# Patient Record
Sex: Female | Born: 1975 | Race: Black or African American | Hispanic: No | Smoking: Current some day smoker
Health system: Southern US, Community
[De-identification: ages and names within clinical notes are randomized; demographics above are authoritative.]

## PROBLEM LIST (undated history)

## (undated) DIAGNOSIS — K5792 Diverticulitis of intestine, part unspecified, without perforation or abscess without bleeding: Secondary | ICD-10-CM

## (undated) DIAGNOSIS — G43909 Migraine, unspecified, not intractable, without status migrainosus: Secondary | ICD-10-CM

## (undated) DIAGNOSIS — D649 Anemia, unspecified: Secondary | ICD-10-CM

## (undated) DIAGNOSIS — J42 Unspecified chronic bronchitis: Secondary | ICD-10-CM

## (undated) DIAGNOSIS — F410 Panic disorder [episodic paroxysmal anxiety] without agoraphobia: Secondary | ICD-10-CM

## (undated) DIAGNOSIS — J45909 Unspecified asthma, uncomplicated: Secondary | ICD-10-CM

## (undated) DIAGNOSIS — K589 Irritable bowel syndrome without diarrhea: Secondary | ICD-10-CM

## (undated) DIAGNOSIS — F419 Anxiety disorder, unspecified: Secondary | ICD-10-CM

## (undated) HISTORY — PX: LAPAROSCOPIC ABDOMINAL EXPLORATION: SHX6249

## (undated) HISTORY — DX: Diverticulitis of intestine, part unspecified, without perforation or abscess without bleeding: K57.92

---
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
## 1998-06-23 ENCOUNTER — Other Ambulatory Visit: Admission: RE | Admit: 1998-06-23 | Discharge: 1998-06-23 | Payer: Self-pay | Admitting: Obstetrics and Gynecology

## 1999-01-09 ENCOUNTER — Inpatient Hospital Stay (HOSPITAL_COMMUNITY): Admission: AD | Admit: 1999-01-09 | Discharge: 1999-01-11 | Payer: Self-pay | Admitting: Obstetrics and Gynecology

## 1999-01-12 ENCOUNTER — Encounter (HOSPITAL_COMMUNITY): Admission: RE | Admit: 1999-01-12 | Discharge: 1999-04-12 | Payer: Self-pay | Admitting: *Deleted

## 1999-04-19 ENCOUNTER — Encounter (HOSPITAL_COMMUNITY): Admission: RE | Admit: 1999-04-19 | Discharge: 1999-07-18 | Payer: Self-pay | Admitting: *Deleted

## 1999-04-21 ENCOUNTER — Observation Stay (HOSPITAL_COMMUNITY): Admission: EM | Admit: 1999-04-21 | Discharge: 1999-04-21 | Payer: Self-pay | Admitting: *Deleted

## 1999-05-23 ENCOUNTER — Other Ambulatory Visit: Admission: RE | Admit: 1999-05-23 | Discharge: 1999-05-23 | Payer: Self-pay | Admitting: Obstetrics & Gynecology

## 1999-11-30 ENCOUNTER — Encounter: Payer: Self-pay | Admitting: Emergency Medicine

## 1999-11-30 ENCOUNTER — Emergency Department (HOSPITAL_COMMUNITY): Admission: EM | Admit: 1999-11-30 | Discharge: 1999-11-30 | Payer: Self-pay | Admitting: Emergency Medicine

## 1999-12-02 ENCOUNTER — Encounter: Payer: Self-pay | Admitting: Emergency Medicine

## 1999-12-02 ENCOUNTER — Emergency Department (HOSPITAL_COMMUNITY): Admission: EM | Admit: 1999-12-02 | Discharge: 1999-12-02 | Payer: Self-pay | Admitting: Emergency Medicine

## 2000-06-07 ENCOUNTER — Emergency Department (HOSPITAL_COMMUNITY): Admission: EM | Admit: 2000-06-07 | Discharge: 2000-06-07 | Payer: Self-pay | Admitting: Internal Medicine

## 2001-06-11 ENCOUNTER — Encounter: Payer: Self-pay | Admitting: Emergency Medicine

## 2001-06-11 ENCOUNTER — Emergency Department (HOSPITAL_COMMUNITY): Admission: EM | Admit: 2001-06-11 | Discharge: 2001-06-11 | Payer: Self-pay | Admitting: Emergency Medicine

## 2001-09-21 ENCOUNTER — Emergency Department (HOSPITAL_COMMUNITY): Admission: EM | Admit: 2001-09-21 | Discharge: 2001-09-21 | Payer: Self-pay | Admitting: Emergency Medicine

## 2001-09-21 ENCOUNTER — Encounter: Payer: Self-pay | Admitting: Emergency Medicine

## 2001-09-26 ENCOUNTER — Other Ambulatory Visit: Admission: RE | Admit: 2001-09-26 | Discharge: 2001-09-26 | Payer: Self-pay | Admitting: *Deleted

## 2002-06-22 ENCOUNTER — Encounter: Admission: RE | Admit: 2002-06-22 | Discharge: 2002-06-22 | Payer: Self-pay | Admitting: Obstetrics and Gynecology

## 2002-06-22 ENCOUNTER — Encounter: Payer: Self-pay | Admitting: Obstetrics and Gynecology

## 2002-06-23 ENCOUNTER — Encounter: Payer: Self-pay | Admitting: Obstetrics and Gynecology

## 2002-06-23 ENCOUNTER — Encounter: Admission: RE | Admit: 2002-06-23 | Discharge: 2002-06-23 | Payer: Self-pay | Admitting: Obstetrics and Gynecology

## 2002-08-26 ENCOUNTER — Encounter: Payer: Self-pay | Admitting: *Deleted

## 2002-08-26 ENCOUNTER — Ambulatory Visit (HOSPITAL_COMMUNITY): Admission: RE | Admit: 2002-08-26 | Discharge: 2002-08-26 | Payer: Self-pay | Admitting: *Deleted

## 2002-08-28 ENCOUNTER — Emergency Department (HOSPITAL_COMMUNITY): Admission: EM | Admit: 2002-08-28 | Discharge: 2002-08-28 | Payer: Self-pay | Admitting: Emergency Medicine

## 2002-11-04 ENCOUNTER — Other Ambulatory Visit: Admission: RE | Admit: 2002-11-04 | Discharge: 2002-11-04 | Payer: Self-pay | Admitting: *Deleted

## 2003-06-15 ENCOUNTER — Emergency Department (HOSPITAL_COMMUNITY): Admission: EM | Admit: 2003-06-15 | Discharge: 2003-06-16 | Payer: Self-pay | Admitting: Emergency Medicine

## 2003-09-01 ENCOUNTER — Ambulatory Visit (HOSPITAL_COMMUNITY): Admission: RE | Admit: 2003-09-01 | Discharge: 2003-09-01 | Payer: Self-pay | Admitting: *Deleted

## 2003-09-11 HISTORY — PX: TUBAL LIGATION: SHX77

## 2003-09-12 ENCOUNTER — Emergency Department (HOSPITAL_COMMUNITY): Admission: AD | Admit: 2003-09-12 | Discharge: 2003-09-12 | Payer: Self-pay | Admitting: Family Medicine

## 2003-10-27 ENCOUNTER — Emergency Department (HOSPITAL_COMMUNITY): Admission: EM | Admit: 2003-10-27 | Discharge: 2003-10-27 | Payer: Self-pay | Admitting: Emergency Medicine

## 2003-11-19 ENCOUNTER — Other Ambulatory Visit: Admission: RE | Admit: 2003-11-19 | Discharge: 2003-11-19 | Payer: Self-pay | Admitting: *Deleted

## 2004-04-11 ENCOUNTER — Inpatient Hospital Stay (HOSPITAL_COMMUNITY): Admission: AD | Admit: 2004-04-11 | Discharge: 2004-04-11 | Payer: Self-pay | Admitting: *Deleted

## 2004-05-10 ENCOUNTER — Inpatient Hospital Stay (HOSPITAL_COMMUNITY): Admission: AD | Admit: 2004-05-10 | Discharge: 2004-05-10 | Payer: Self-pay

## 2004-07-13 ENCOUNTER — Inpatient Hospital Stay (HOSPITAL_COMMUNITY): Admission: AD | Admit: 2004-07-13 | Discharge: 2004-07-13 | Payer: Self-pay | Admitting: Obstetrics and Gynecology

## 2004-07-19 ENCOUNTER — Inpatient Hospital Stay (HOSPITAL_COMMUNITY): Admission: AD | Admit: 2004-07-19 | Discharge: 2004-07-21 | Payer: Self-pay | Admitting: Obstetrics and Gynecology

## 2004-07-20 ENCOUNTER — Encounter (INDEPENDENT_AMBULATORY_CARE_PROVIDER_SITE_OTHER): Payer: Self-pay | Admitting: *Deleted

## 2004-08-13 ENCOUNTER — Emergency Department (HOSPITAL_COMMUNITY): Admission: EM | Admit: 2004-08-13 | Discharge: 2004-08-13 | Payer: Self-pay

## 2004-09-10 HISTORY — PX: VAGINAL HYSTERECTOMY: SUR661

## 2004-10-03 ENCOUNTER — Emergency Department (HOSPITAL_COMMUNITY): Admission: EM | Admit: 2004-10-03 | Discharge: 2004-10-03 | Payer: Self-pay | Admitting: Emergency Medicine

## 2004-11-29 ENCOUNTER — Inpatient Hospital Stay (HOSPITAL_COMMUNITY): Admission: AD | Admit: 2004-11-29 | Discharge: 2004-11-29 | Payer: Self-pay | Admitting: Obstetrics and Gynecology

## 2005-03-03 ENCOUNTER — Inpatient Hospital Stay (HOSPITAL_COMMUNITY): Admission: AD | Admit: 2005-03-03 | Discharge: 2005-03-03 | Payer: Self-pay | Admitting: Family Medicine

## 2005-03-22 ENCOUNTER — Ambulatory Visit: Payer: Self-pay | Admitting: Obstetrics and Gynecology

## 2005-03-31 ENCOUNTER — Inpatient Hospital Stay (HOSPITAL_COMMUNITY): Admission: AD | Admit: 2005-03-31 | Discharge: 2005-03-31 | Payer: Self-pay | Admitting: Family Medicine

## 2005-05-02 ENCOUNTER — Ambulatory Visit (HOSPITAL_COMMUNITY): Admission: RE | Admit: 2005-05-02 | Discharge: 2005-05-02 | Payer: Self-pay | Admitting: Obstetrics and Gynecology

## 2005-05-08 ENCOUNTER — Encounter: Payer: Self-pay | Admitting: Emergency Medicine

## 2005-05-09 ENCOUNTER — Inpatient Hospital Stay (HOSPITAL_COMMUNITY): Admission: AD | Admit: 2005-05-09 | Discharge: 2005-05-11 | Payer: Self-pay | Admitting: General Surgery

## 2005-06-04 ENCOUNTER — Inpatient Hospital Stay (HOSPITAL_COMMUNITY): Admission: RE | Admit: 2005-06-04 | Discharge: 2005-06-06 | Payer: Self-pay | Admitting: Obstetrics and Gynecology

## 2005-06-04 ENCOUNTER — Encounter (INDEPENDENT_AMBULATORY_CARE_PROVIDER_SITE_OTHER): Payer: Self-pay | Admitting: *Deleted

## 2005-06-04 ENCOUNTER — Ambulatory Visit: Payer: Self-pay | Admitting: Obstetrics and Gynecology

## 2005-06-08 ENCOUNTER — Inpatient Hospital Stay (HOSPITAL_COMMUNITY): Admission: AD | Admit: 2005-06-08 | Discharge: 2005-06-11 | Payer: Self-pay | Admitting: *Deleted

## 2005-06-08 ENCOUNTER — Ambulatory Visit: Payer: Self-pay | Admitting: Certified Nurse Midwife

## 2005-06-14 ENCOUNTER — Ambulatory Visit: Payer: Self-pay | Admitting: Obstetrics and Gynecology

## 2005-07-12 ENCOUNTER — Ambulatory Visit: Payer: Self-pay | Admitting: Family Medicine

## 2005-08-04 ENCOUNTER — Emergency Department (HOSPITAL_COMMUNITY): Admission: EM | Admit: 2005-08-04 | Discharge: 2005-08-04 | Payer: Self-pay | Admitting: Family Medicine

## 2005-09-11 ENCOUNTER — Emergency Department (HOSPITAL_COMMUNITY): Admission: EM | Admit: 2005-09-11 | Discharge: 2005-09-11 | Payer: Self-pay | Admitting: Family Medicine

## 2005-11-09 ENCOUNTER — Ambulatory Visit: Payer: Self-pay | Admitting: Family Medicine

## 2005-11-12 ENCOUNTER — Ambulatory Visit (HOSPITAL_COMMUNITY): Admission: RE | Admit: 2005-11-12 | Discharge: 2005-11-12 | Payer: Self-pay | Admitting: *Deleted

## 2005-11-29 ENCOUNTER — Ambulatory Visit: Payer: Self-pay | Admitting: Family Medicine

## 2005-11-30 ENCOUNTER — Emergency Department (HOSPITAL_COMMUNITY): Admission: EM | Admit: 2005-11-30 | Discharge: 2005-11-30 | Payer: Self-pay | Admitting: Family Medicine

## 2005-12-26 ENCOUNTER — Emergency Department (HOSPITAL_COMMUNITY): Admission: EM | Admit: 2005-12-26 | Discharge: 2005-12-26 | Payer: Self-pay | Admitting: Family Medicine

## 2006-01-02 ENCOUNTER — Emergency Department (HOSPITAL_COMMUNITY): Admission: EM | Admit: 2006-01-02 | Discharge: 2006-01-02 | Payer: Self-pay | Admitting: Emergency Medicine

## 2006-01-22 ENCOUNTER — Emergency Department (HOSPITAL_COMMUNITY): Admission: EM | Admit: 2006-01-22 | Discharge: 2006-01-22 | Payer: Self-pay | Admitting: Emergency Medicine

## 2006-04-08 ENCOUNTER — Emergency Department (HOSPITAL_COMMUNITY): Admission: EM | Admit: 2006-04-08 | Discharge: 2006-04-08 | Payer: Self-pay | Admitting: Family Medicine

## 2006-04-10 ENCOUNTER — Ambulatory Visit: Payer: Self-pay | Admitting: Obstetrics and Gynecology

## 2006-04-18 ENCOUNTER — Emergency Department (HOSPITAL_COMMUNITY): Admission: EM | Admit: 2006-04-18 | Discharge: 2006-04-18 | Payer: Self-pay | Admitting: Family Medicine

## 2006-06-20 ENCOUNTER — Encounter: Admission: RE | Admit: 2006-06-20 | Discharge: 2006-06-20 | Payer: Self-pay | Admitting: Family Medicine

## 2006-08-28 IMAGING — US US TRANSVAGINAL NON-OB
1 series · 14 of 24 positions shown · non-contrast
Comparison: none

HISTORY: Endometriosis, left lower quadrant pain

[Series 1: us transvaginal non-ob · 0.17mm/px · 14 of 24 slices shown]
[im 1/24]
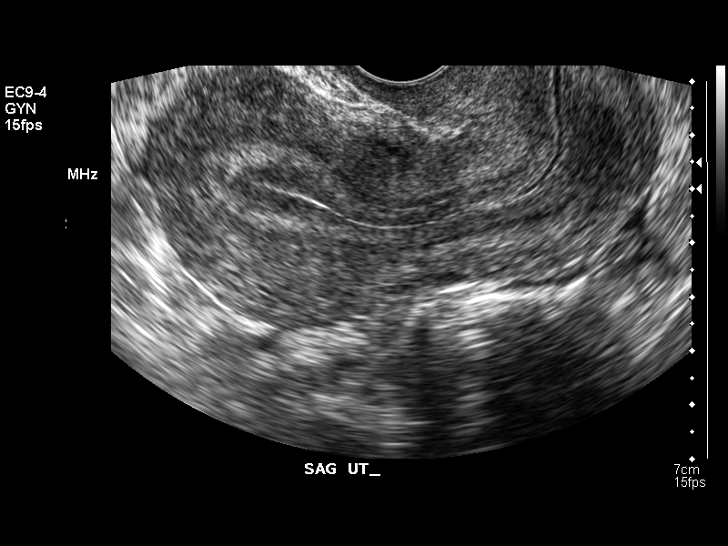
[im 3/24]
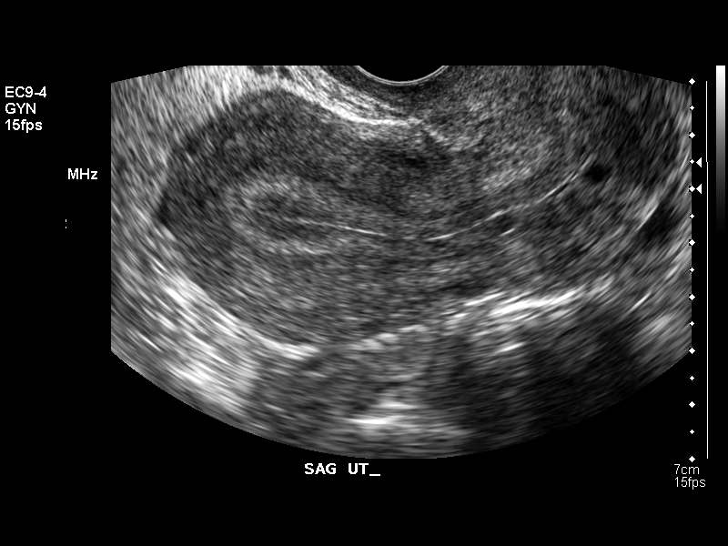
[im 5/24]
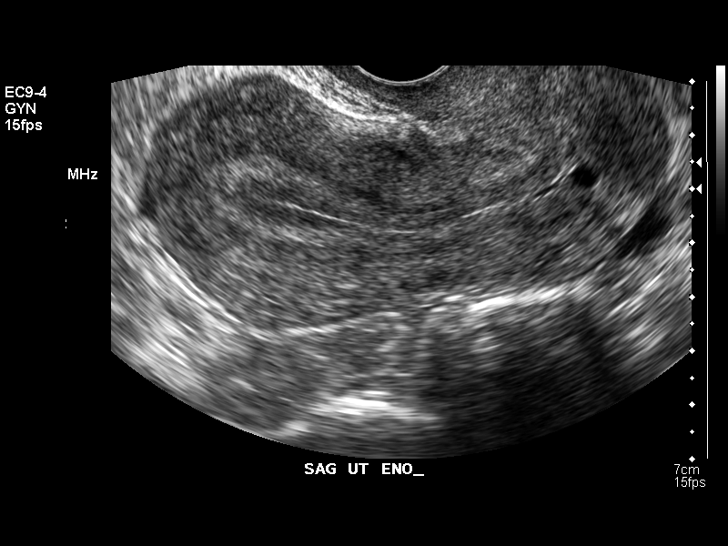
[im 7/24]
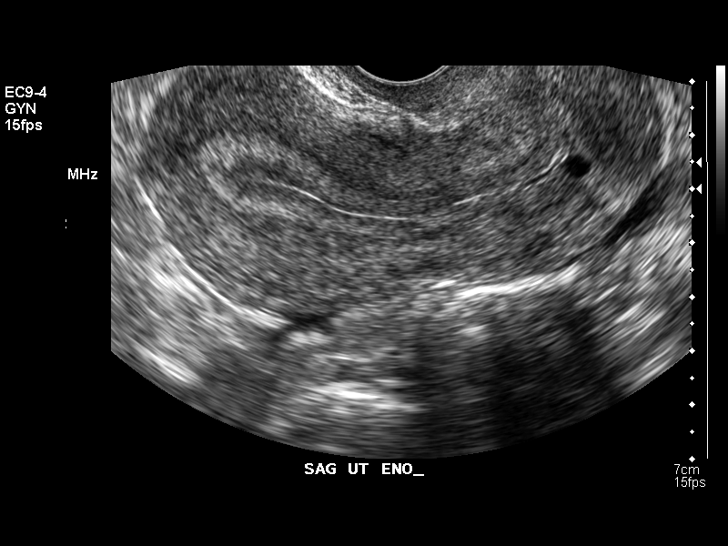
[im 8/24]
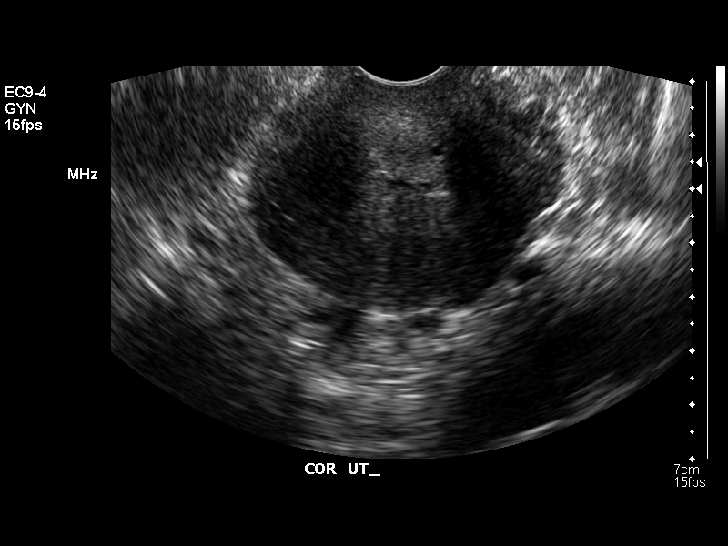
[im 10/24]
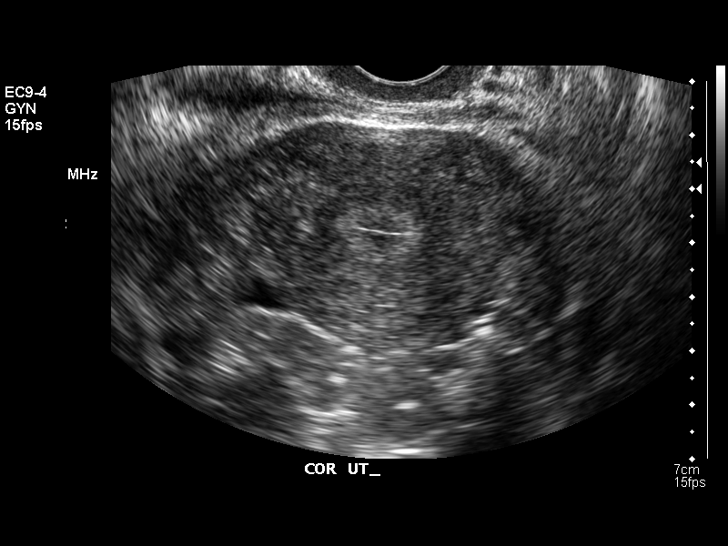
[im 12/24]
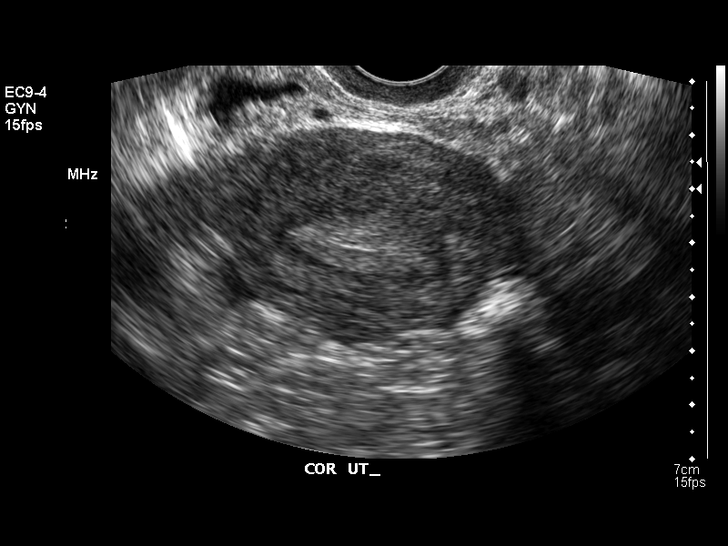
[im 13/24]
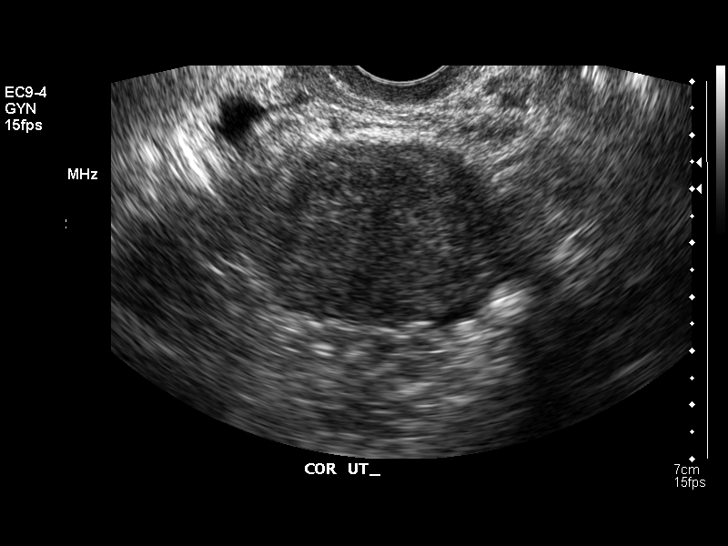
[im 15/24]
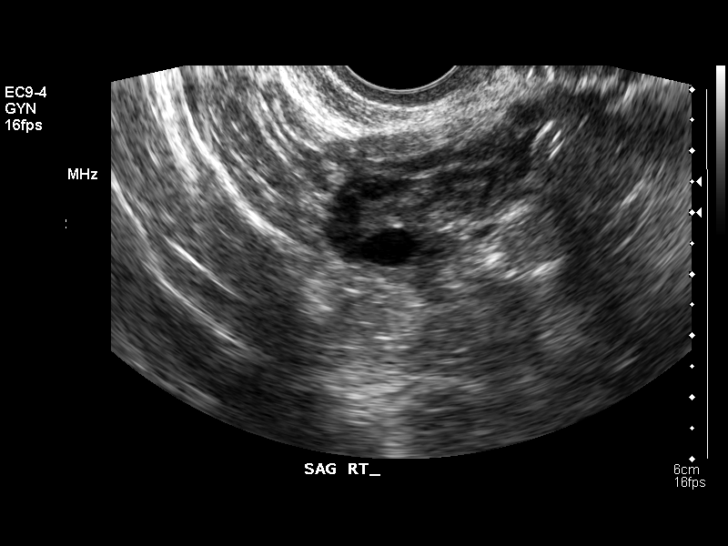
[im 17/24]
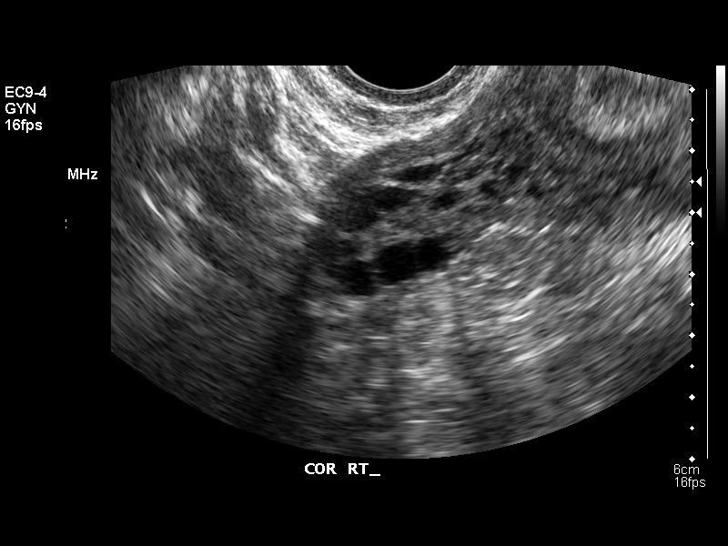
[im 19/24]
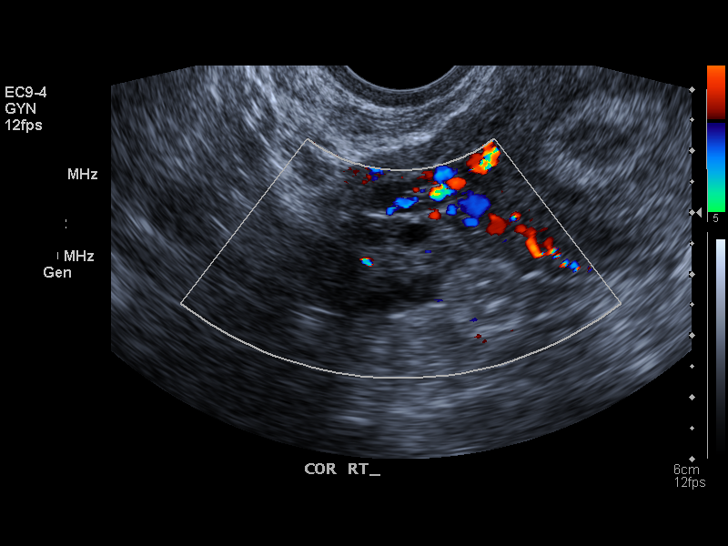
[im 20/24]
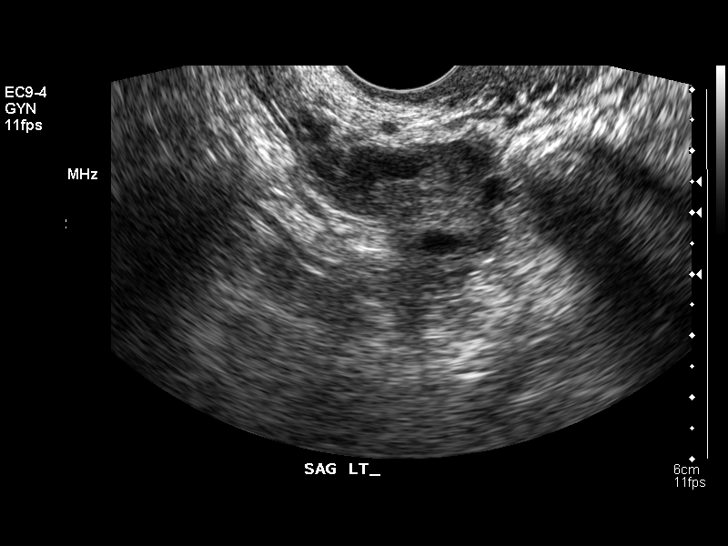
[im 22/24]
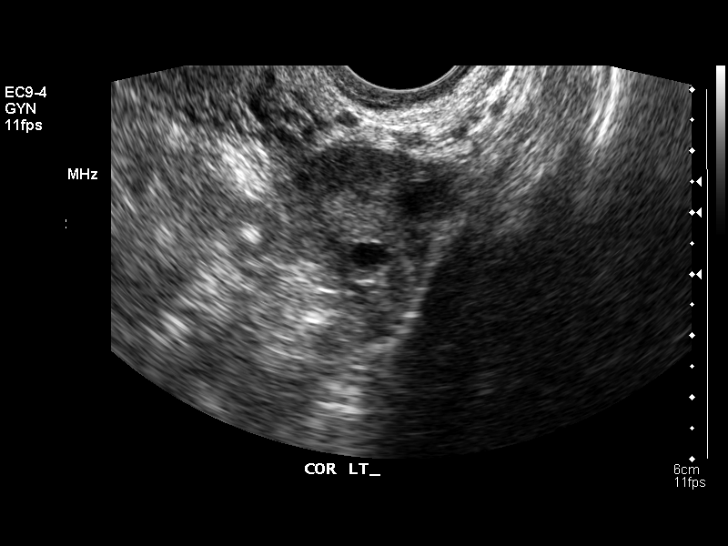
[im 24/24]
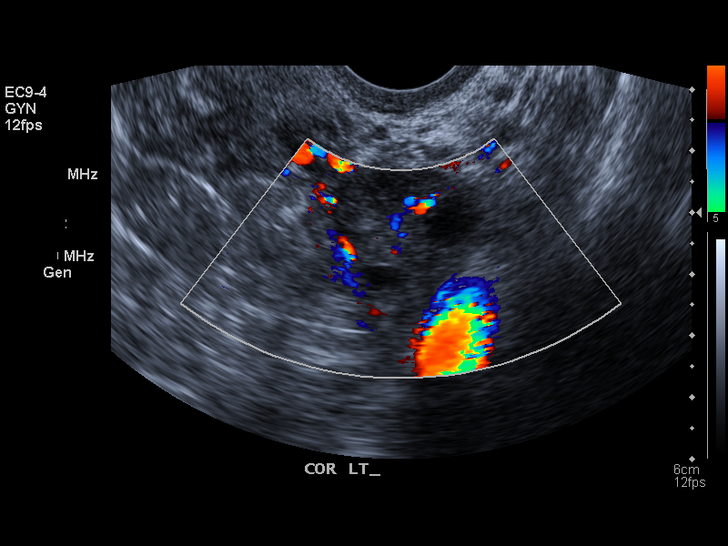

[14 of 24 positions shown; findings below may reference images not displayed]

ULTRASOUND PELVIS TRANSVAGINAL NON OBSTETRICAL:

Transvaginal sonography of pelvis performed.

Uterus normal size and morphology, 9.7 cm length x 4.8 cm AP x 5.2 cm
transverse.
Endometrial stripe 16 mm thick, question related to phase menses.
No uterine mass.
Tiny amount of free pelvic fluid.
Tiny nabothian cyst in cervix.
No endocervical or endometrial fluid.

Right ovary normal size morphology, 3.2 x 1.7 x 3.2 cm.
Left ovary normal size morphology 2.0 x 2.5 x 2.0 cm.
No adnexal masses.
IMPRESSION: No significant abnormalities.

## 2006-09-15 ENCOUNTER — Inpatient Hospital Stay (HOSPITAL_COMMUNITY): Admission: AD | Admit: 2006-09-15 | Discharge: 2006-09-15 | Payer: Self-pay | Admitting: Obstetrics & Gynecology

## 2006-09-23 ENCOUNTER — Emergency Department (HOSPITAL_COMMUNITY): Admission: EM | Admit: 2006-09-23 | Discharge: 2006-09-24 | Payer: Self-pay | Admitting: Emergency Medicine

## 2006-10-06 ENCOUNTER — Inpatient Hospital Stay (HOSPITAL_COMMUNITY): Admission: AD | Admit: 2006-10-06 | Discharge: 2006-10-06 | Payer: Self-pay | Admitting: Obstetrics & Gynecology

## 2006-11-13 ENCOUNTER — Emergency Department (HOSPITAL_COMMUNITY): Admission: EM | Admit: 2006-11-13 | Discharge: 2006-11-13 | Payer: Self-pay | Admitting: Family Medicine

## 2006-12-03 ENCOUNTER — Emergency Department (HOSPITAL_COMMUNITY): Admission: EM | Admit: 2006-12-03 | Discharge: 2006-12-03 | Payer: Self-pay | Admitting: Family Medicine

## 2007-01-15 ENCOUNTER — Ambulatory Visit: Payer: Self-pay | Admitting: Obstetrics & Gynecology

## 2007-01-24 ENCOUNTER — Emergency Department (HOSPITAL_COMMUNITY): Admission: EM | Admit: 2007-01-24 | Discharge: 2007-01-24 | Payer: Self-pay | Admitting: Emergency Medicine

## 2007-02-23 ENCOUNTER — Inpatient Hospital Stay (HOSPITAL_COMMUNITY): Admission: AD | Admit: 2007-02-23 | Discharge: 2007-02-23 | Payer: Self-pay | Admitting: Obstetrics & Gynecology

## 2007-03-11 ENCOUNTER — Emergency Department (HOSPITAL_COMMUNITY): Admission: EM | Admit: 2007-03-11 | Discharge: 2007-03-11 | Payer: Self-pay | Admitting: Family Medicine

## 2007-03-24 ENCOUNTER — Emergency Department (HOSPITAL_COMMUNITY): Admission: EM | Admit: 2007-03-24 | Discharge: 2007-03-24 | Payer: Self-pay | Admitting: Emergency Medicine

## 2007-03-31 ENCOUNTER — Encounter: Admission: RE | Admit: 2007-03-31 | Discharge: 2007-06-29 | Payer: Self-pay | Admitting: Family Medicine

## 2007-04-15 ENCOUNTER — Emergency Department (HOSPITAL_COMMUNITY): Admission: EM | Admit: 2007-04-15 | Discharge: 2007-04-15 | Payer: Self-pay | Admitting: Emergency Medicine

## 2007-05-01 ENCOUNTER — Ambulatory Visit: Payer: Self-pay | Admitting: Obstetrics and Gynecology

## 2007-07-27 ENCOUNTER — Emergency Department (HOSPITAL_COMMUNITY): Admission: EM | Admit: 2007-07-27 | Discharge: 2007-07-27 | Payer: Self-pay | Admitting: Emergency Medicine

## 2007-08-16 ENCOUNTER — Emergency Department (HOSPITAL_COMMUNITY): Admission: EM | Admit: 2007-08-16 | Discharge: 2007-08-16 | Payer: Self-pay | Admitting: Emergency Medicine

## 2007-12-04 ENCOUNTER — Emergency Department (HOSPITAL_COMMUNITY): Admission: EM | Admit: 2007-12-04 | Discharge: 2007-12-05 | Payer: Self-pay | Admitting: Emergency Medicine

## 2008-01-12 ENCOUNTER — Emergency Department (HOSPITAL_COMMUNITY): Admission: EM | Admit: 2008-01-12 | Discharge: 2008-01-12 | Payer: Self-pay | Admitting: Emergency Medicine

## 2008-02-06 ENCOUNTER — Emergency Department (HOSPITAL_COMMUNITY): Admission: EM | Admit: 2008-02-06 | Discharge: 2008-02-06 | Payer: Self-pay | Admitting: Emergency Medicine

## 2008-04-09 ENCOUNTER — Inpatient Hospital Stay (HOSPITAL_COMMUNITY): Admission: AD | Admit: 2008-04-09 | Discharge: 2008-04-09 | Payer: Self-pay | Admitting: Obstetrics & Gynecology

## 2008-04-30 ENCOUNTER — Inpatient Hospital Stay (HOSPITAL_COMMUNITY): Admission: AD | Admit: 2008-04-30 | Discharge: 2008-04-30 | Payer: Self-pay | Admitting: Obstetrics & Gynecology

## 2008-05-29 ENCOUNTER — Emergency Department (HOSPITAL_COMMUNITY): Admission: EM | Admit: 2008-05-29 | Discharge: 2008-05-29 | Payer: Self-pay | Admitting: Emergency Medicine

## 2008-09-29 ENCOUNTER — Emergency Department (HOSPITAL_COMMUNITY): Admission: EM | Admit: 2008-09-29 | Discharge: 2008-09-30 | Payer: Self-pay | Admitting: Emergency Medicine

## 2008-12-30 ENCOUNTER — Ambulatory Visit: Payer: Self-pay | Admitting: Internal Medicine

## 2008-12-30 DIAGNOSIS — D649 Anemia, unspecified: Secondary | ICD-10-CM | POA: Insufficient documentation

## 2008-12-30 DIAGNOSIS — R195 Other fecal abnormalities: Secondary | ICD-10-CM

## 2008-12-30 DIAGNOSIS — R1084 Generalized abdominal pain: Secondary | ICD-10-CM | POA: Insufficient documentation

## 2008-12-30 LAB — CONVERTED CEMR LAB
ALT: 14 units/L (ref 0–35)
AST: 15 units/L (ref 0–37)
Albumin: 4.5 g/dL (ref 3.5–5.2)
Alkaline Phosphatase: 54 units/L (ref 39–117)
Amylase: 86 units/L (ref 27–131)
BUN: 5 mg/dL — ABNORMAL LOW (ref 6–23)
CO2: 31 meq/L (ref 19–32)
Calcium: 9.4 mg/dL (ref 8.4–10.5)
Chloride: 105 meq/L (ref 96–112)
Creatinine, Ser: 0.8 mg/dL (ref 0.4–1.2)
Lymphocytes Relative: 10.3 % — ABNORMAL LOW (ref 12.0–46.0)
MCHC: 33.6 g/dL (ref 30.0–36.0)
MCV: 89 fL (ref 78.0–100.0)
Neutrophils Relative %: 87.8 % — ABNORMAL HIGH (ref 43.0–77.0)
Platelets: 218 10*3/uL (ref 150.0–400.0)
RDW: 13.5 % (ref 11.5–14.6)
Saturation Ratios: 13.8 % — ABNORMAL LOW (ref 20.0–50.0)
Total Protein: 7.5 g/dL (ref 6.0–8.3)

## 2008-12-31 ENCOUNTER — Encounter: Payer: Self-pay | Admitting: Internal Medicine

## 2009-01-03 ENCOUNTER — Encounter (INDEPENDENT_AMBULATORY_CARE_PROVIDER_SITE_OTHER): Payer: Self-pay | Admitting: *Deleted

## 2009-01-28 ENCOUNTER — Emergency Department (HOSPITAL_COMMUNITY): Admission: EM | Admit: 2009-01-28 | Discharge: 2009-01-28 | Payer: Self-pay | Admitting: Family Medicine

## 2009-02-03 ENCOUNTER — Ambulatory Visit: Payer: Self-pay | Admitting: Gastroenterology

## 2009-05-20 ENCOUNTER — Telehealth: Payer: Self-pay | Admitting: Internal Medicine

## 2009-08-10 ENCOUNTER — Emergency Department (HOSPITAL_COMMUNITY): Admission: EM | Admit: 2009-08-10 | Discharge: 2009-08-10 | Payer: Self-pay | Admitting: Emergency Medicine

## 2009-08-11 ENCOUNTER — Encounter (INDEPENDENT_AMBULATORY_CARE_PROVIDER_SITE_OTHER): Payer: Self-pay | Admitting: *Deleted

## 2009-09-12 ENCOUNTER — Ambulatory Visit: Payer: Self-pay | Admitting: Gastroenterology

## 2009-09-12 ENCOUNTER — Encounter (INDEPENDENT_AMBULATORY_CARE_PROVIDER_SITE_OTHER): Payer: Self-pay | Admitting: *Deleted

## 2009-09-12 DIAGNOSIS — R634 Abnormal weight loss: Secondary | ICD-10-CM

## 2009-09-12 DIAGNOSIS — R109 Unspecified abdominal pain: Secondary | ICD-10-CM | POA: Insufficient documentation

## 2009-09-12 DIAGNOSIS — R197 Diarrhea, unspecified: Secondary | ICD-10-CM

## 2009-09-14 LAB — CONVERTED CEMR LAB: IgA: 185 mg/dL (ref 68–378)

## 2009-09-26 ENCOUNTER — Ambulatory Visit: Payer: Self-pay | Admitting: Gastroenterology

## 2009-09-27 ENCOUNTER — Encounter (INDEPENDENT_AMBULATORY_CARE_PROVIDER_SITE_OTHER): Payer: Self-pay

## 2009-09-27 ENCOUNTER — Emergency Department (HOSPITAL_COMMUNITY): Admission: EM | Admit: 2009-09-27 | Discharge: 2009-09-27 | Payer: Self-pay | Admitting: Emergency Medicine

## 2009-09-29 ENCOUNTER — Encounter: Payer: Self-pay | Admitting: Gastroenterology

## 2009-10-11 ENCOUNTER — Emergency Department (HOSPITAL_COMMUNITY): Admission: EM | Admit: 2009-10-11 | Discharge: 2009-10-11 | Payer: Self-pay | Admitting: Family Medicine

## 2009-11-03 ENCOUNTER — Ambulatory Visit: Payer: Self-pay | Admitting: Gastroenterology

## 2009-11-03 DIAGNOSIS — K589 Irritable bowel syndrome without diarrhea: Secondary | ICD-10-CM

## 2010-06-20 ENCOUNTER — Emergency Department (HOSPITAL_COMMUNITY): Admission: EM | Admit: 2010-06-20 | Discharge: 2010-06-20 | Payer: Self-pay | Admitting: Family Medicine

## 2010-10-01 ENCOUNTER — Encounter: Payer: Self-pay | Admitting: Orthopaedic Surgery

## 2010-10-10 ENCOUNTER — Emergency Department (HOSPITAL_COMMUNITY)
Admission: EM | Admit: 2010-10-10 | Discharge: 2010-10-10 | Payer: Self-pay | Source: Home / Self Care | Admitting: Emergency Medicine

## 2010-10-10 LAB — POCT CARDIAC MARKERS: Troponin i, poc: <0.05 ng/mL (ref 0.00–0.09)

## 2010-10-10 LAB — COMPREHENSIVE METABOLIC PANEL
AST: 20 U/L (ref 0–37)
Albumin: 4.9 g/dL (ref 3.5–5.2)
Alkaline Phosphatase: 50 U/L (ref 39–117)
BUN: 5 mg/dL — ABNORMAL LOW (ref 6–23)
Creatinine, Ser: 0.8 mg/dL (ref 0.4–1.2)
GFR calc Af Amer: >60 mL/min (ref >60)
Potassium: 3.3 mEq/L — ABNORMAL LOW (ref 3.5–5.1)
Total Protein: 7.8 g/dL (ref 6.0–8.3)

## 2010-10-10 LAB — D-DIMER, QUANTITATIVE: D-Dimer, Quant: 0.23 ug/mL-FEU (ref 0.00–0.48)

## 2010-10-10 LAB — CBC
HCT: 39.9 % (ref 36.0–46.0)
Hemoglobin: 12.9 g/dL (ref 12.0–15.0)
MCH: 27.7 pg (ref 26.0–34.0)
MCV: 85.8 fL (ref 78.0–100.0)
RBC: 4.65 MIL/uL (ref 3.87–5.11)

## 2010-10-10 NOTE — Letter (Signed)
Summary: Patient Notice- Colon Biospy Results  [facility]  [address] [place], [place] [address]   Phone: [phone]  Fax: [phone]        September 29, 2009 MRN: [id number]    [facility] [address] [place], [place]  [address]    Dear Laura Hudson,  I am pleased to inform you that the biopsies taken during your recent colonoscopy and endoscopy did not show any evidence of cancer upon pathologic examination. The biopsies were normal.  Continue with the treatment plan as outlined on the day of your      exam.  Please call us if you are having persistent problems or have questions about your condition that have not been fully answered at this time.  Sincerely,  Meryl Dare MD [facility]  This letter has been electronically signed by your physician.  Appended Document: Patient Notice- Colon Biospy Results Letter mailed 1.21.11

## 2010-10-10 NOTE — Assessment & Plan Note (Signed)
Summary: ER F/U//ABD PAIN//IBS DIAGNOSED...AS.   History of Present Illness Visit Type: new patient Primary GI MD: Elie Goody MD [facility] Primary Provider: Sanda Linger, MD Requesting Provider: Doug Sou, MD Chief Complaint: stomach pain with alternating diarrhea and constipation.  Pt was given bentyl in ER which helped, also given phenergan.  Pt out of these medications History of Present Illness:   This is a 35 year old female previously seen by Dr. Yetta Barre, who relates a long history of irritable bowel syndrome. She states she had predominantly constipation problems associated with lower abdominal pain and was evaluated by Dr. Dortha Kern in 2005 and told of irritable bowel syndrome. She has not had endoscopic studies or barium evaluations performed.  Over the past 18 months she has had problems with alternating diarrhea and constipation appetite, loss, nausea, and lower abdominal pain. She reports and 36 pound weight loss over the past 18 months. For the past 3 weeks, she has been having worsening problems with ongoing nonbloody, watery diarrhea occurring 3-5 times per day, associated with crampy lower abdominal pain that is relieved with a bowel movement. she is noted. A few episodes of scant amounts of bright red blood per rectum, when she's had several loose bowel movements. She was found to have heme positive stool on physical examination in April of this year along with an iron deficiency. Stool Hemoccults at a recent ER evaluation were negative and standard blood work was normal.   GI Review of Systems    Reports abdominal pain, loss of appetite, nausea, and  weight loss.   Weight loss of 36 pounds over 18 months.   Denies acid reflux, belching, bloating, chest pain, dysphagia with liquids, dysphagia with solids, heartburn, vomiting, vomiting blood, and  weight gain.      Reports constipation, diarrhea, hemorrhoids, irritable bowel syndrome, and  rectal bleeding.     Denies anal  fissure, black tarry stools, change in bowel habit, diverticulosis, fecal incontinence, heme positive stool, jaundice, light color stool, liver problems, and  rectal pain. Preventive Screening-Counseling & Management      Drug Use:  yes.     Current Medications (verified): 1)  Xanax 0.5 Mg Tabs (Alprazolam) 2)  Celexa 20 Mg Tabs (Citalopram Hydrobromide)  Allergies (verified): 1)  ! Keflex  Past History:  Past Medical History: Anemia fe deficency IBS Migraine HA's Endometriosis Anxiety Disorder  Past Surgical History: Hysterectomy, partial 2006 Tubal ligation 2005 D & C 1990 surgical removal of Norplant  Family History: Family History of Alcoholism/Addiction Family History Depression Family History Hypertension Family History of Stroke F 1st degree relative <60 Family History of Breast Cancer: Mat. Aunt No FH of Colon Cancer: Family History or Renal Cancer: Mother Family History of Diabetes: both sides of family  Social History: Divorced--engaged 1 boy, 3 girls Current Smoker Alcohol use-occ  Regular exercise-yes Unemployed Daily Caffeine Use 2-3 drinks daily Illicit Drug Use - yes Drug Use:  yes  Review of Systems       The patient complains of allergy/sinus, fatigue, night sweats, sleeping problems, and sore throat.         The pertinent positives and negatives are noted as above and in the HPI. All other ROS were reviewed and were negative.   Vital Signs:  Patient profile:   35 year old female Height:      63 inches Weight:      109 pounds BMI:     19.38 Pulse rate:   64 / minute Pulse rhythm:  regular BP sitting:   100 / 60  (left arm) Cuff size:   regular  Vitals Entered By: Francee Piccolo CMA Duncan Dull) (September 12, 2009 9:46 AM)  Physical Exam  General:  Well developed, well nourished, no acute distress. Head:  Normocephalic and atraumatic. Eyes:  PERRLA, no icterus. Ears:  Normal auditory acuity. Mouth:  No deformity or lesions,  dentition normal. Neck:  Supple; no masses or thyromegaly. Lungs:  Clear throughout to auscultation. Heart:  Regular rate and rhythm; no murmurs, rubs,  or bruits. Abdomen:  Soft and nondistended. No masses, hepatosplenomegaly or hernias noted. Normal bowel sounds. Mild to moderate lower abdominal tenderness, left lower quadrant greater than right lower quadrant. No rebound or guarding. Rectal:  deferred until time of colonoscopy.   Msk:  Symmetrical with no gross deformities. Normal posture. Pulses:  Normal pulses noted. Extremities:  No clubbing, cyanosis, edema or deformities noted. Neurologic:  Alert and  oriented x4;  grossly normal neurologically. Skin:  Intact without significant lesions or rashes. Cervical Nodes:  No significant cervical adenopathy. Axillary Nodes:  No significant axillary adenopathy. Inguinal Nodes:  No significant inguinal adenopathy. Psych:  Alert and cooperative. Normal mood and affect.  Impression & Recommendations:  Problem # 1:  DIARRHEA (ICD-787.91) Worsening diarrhea associated with normal amounts of rectal bleeding, weight loss, nausea, anorexia and lower abdominal pain. Rule out inflammatory bowel disease, ulcer disease. The risks, benefits and alternatives to colonoscopy with possible biopsy and possible polypectomy were discussed with the patient and they consent to proceed. The procedure will be scheduled electively. The risks, benefits and alternatives to endoscopy with possible biopsy and possible dilation were discussed with the patient and they consent to proceed. The procedure will be scheduled electively. Orders: Colon/Endo (Colon/Endo) TLB-TSH (Thyroid Stimulating Hormone) (84443-TSH) TLB-IgA (Immunoglobulin A) (82784-IGA) T-Sprue Panel (Celiac Disease Aby Eval) (83516x3/[address])  Problem # 2:  ABDOMINAL PAIN OTHER SPECIFIED SITE (ICD-789.09) As in problem #1.  Problem # 3:  BLOOD IN STOOL, OCCULT (ICD-792.1) As above.  Problem # 4:   WEIGHT LOSS (ICD-783.21) As above.  Patient Instructions: 1)  Get your labs drawn today in the basement. 2)  Pick up your prescriptions at your pharmacy.  3)  Colonoscopy brochure given.  4)  Upper Endoscopy brochure given.  5)  Copy sent to : Sanda Linger, MD 6)                         Doug Sou, MD 7)  The medication list was reviewed and reconciled.  All changed / newly prescribed medications were explained.  A complete medication list was provided to the patient / caregiver.  Prescriptions: DICYCLOMINE HCL 10 MG CAPS (DICYCLOMINE HCL) one capsule by mouth four times a day as needed  #100 x 1   Entered by:   Christie Nottingham CMA (AAMA)   Authorized by:   Meryl Dare MD [facility]   Signed by:   Christie Nottingham CMA Duncan Dull) on 09/12/2009   Method used:   Electronically to        [facility] [phone]* (retail)       [address]       [place], [place]  [address]       Ph: [phone]       Fax: [phone]   RxID:   [id number] MOVIPREP 100 GM  SOLR (PEG-KCL-NACL-NASULF-NA ASC-C) As per prep instructions.  #1 x 0   Entered by:   Christie Nottingham CMA (  AAMA)   Authorized by:   Meryl Dare MD [facility]   Signed by:   Christie Nottingham CMA Duncan Dull) on 09/12/2009   Method used:   Electronically to        [facility] [phone]* (retail)       [address]       [place], [place]  [address]       Ph: [phone]       Fax: [phone]   RxID:   [id number]

## 2010-10-10 NOTE — Procedures (Signed)
Summary: Upper Endoscopy  Patient: Shaleena Crusoe Note: All result statuses are Final unless otherwise noted.  Tests: (1) Upper Endoscopy (EGD)   EGD Upper Endoscopy       DONE     [facility]     [address]     [place], [place]  [address]           ENDOSCOPY PROCEDURE REPORT           PATIENT:  Patrice, Matthew  MR#:  [id number]     BIRTHDATE:  05/11/1976, 33 yrs. old  GENDER:  female           ENDOSCOPIST:  Judie Petit T. Russella Dar, MD, [facility]           PROCEDURE DATE:  09/26/2009     PROCEDURE:  EGD with biopsy     ASA CLASS:  Class II     INDICATIONS:  diarrhea, weight loss, abdominal pain, multiple     sites           MEDICATIONS:  There was residual sedation effect present from     prior procedure, Fentanyl 25 mcg, Versed 2 mg IV     TOPICAL ANESTHETIC:  Exactacain Spray           DESCRIPTION OF PROCEDURE:   After the risks benefits and     alternatives of the procedure were thoroughly explained, informed     consent was obtained.  The LB GIF-H180 [id number] endoscope was     introduced through the mouth and advanced to the second portion of     the duodenum, without limitations.  The instrument was slowly     withdrawn as the mucosa was fully examined.     <<PROCEDUREIMAGES>>           The esophagus and gastroesophageal junction were completely normal     in appearance.  The stomach was entered and closely examined. The     pylorus, antrum, angularis, and lesser curvature were well     visualized, including a retroflexed view of the cardia and fundus.     The stomach wall was normally distensable. The scope passed easily     through the pylorus into the duodenum. The duodenal bulb was     normal in appearance, as was the postbulbar duodenum. Random     biopsies were obtained and sent to pathology.  Retroflexed views     revealed no abnormalities.  The scope was then withdrawn from the     patient and the procedure completed.           COMPLICATIONS:  None         ENDOSCOPIC IMPRESSION:     1) Normal EGD           RECOMMENDATIONS:     1) await pathology results     2) DC dicyclomine     3) glycopyrrolate 2mg  po bid, #60, 11 refills     4) Align one daily for 2 months     5) Prilosec OTC 20mg  po daily for 4 weeks     5) Office visit in 4 weeks           Malcolm T. Russella Dar, MD, [facility]           CC:  Etta Grandchild, MD           n.     Rosalie DoctorVenita Lick. Stark at 09/26/2009 03:23 PM  [place], [place], [phone]  Note: An exclamation mark (!) indicates a result that was not dispersed into the flowsheet. Document Creation Date: 09/26/2009 3:22 PM _______________________________________________________________________  (1) Order result status: Final Collection or observation date-time: 09/26/2009 15:17 Requested date-time:  Receipt date-time:  Reported date-time:  Referring Physician:   Ordering Physician: Claudette Head [phone]) Specimen Source:  Source: Launa Grill Order Number: [id number] Lab site:

## 2010-10-10 NOTE — Letter (Signed)
Summary: Appt Reminder 2  [facility]  [address] [place], [place] [address]   Phone: [phone]  Fax: [phone]        September 27, 2009 MRN: [id number]    [facility] [address] [place], [place]  [address]    Dear Ms. HEDGEPETH,   You have a return appointment with Dr. Russella Dar on 11/03/01 at 8:30 am.  Please remember to bring a complete list of the medicines you are taking, your insurance card and your co-pay.  If you have to cancel or reschedule this appointment, please call before 5:00 pm the evening before to avoid a cancellation fee.  If you have any questions or concerns, please call [phone].    Sincerely,    Darcey Nora RN, CGRN

## 2010-10-10 NOTE — Miscellaneous (Signed)
  Clinical Lists Changes  Medications: Added new medication of GLYCOPYRROLATE 2 MG  TABS (GLYCOPYRROLATE) BID - Signed Added new medication of PRILOSEC OTC 20 MG  TBEC (OMEPRAZOLE MAGNESIUM) 1 each day 30 minutes before meal - Signed Rx of GLYCOPYRROLATE 2 MG  TABS (GLYCOPYRROLATE) BID;  #60 x 11;  Signed;  Entered by: Doristine Church RN II;  Authorized by: Meryl Dare MD [facility];  Method used: Electronically to [facility] [phone]*, [address], [place], [place]  [address], Ph: [phone], Fax: [phone] Rx of PRILOSEC OTC 20 MG  TBEC (OMEPRAZOLE MAGNESIUM) 1 each day 30 minutes before meal;  #31 x 0;  Signed;  Entered by: Doristine Church RN II;  Authorized by: Meryl Dare MD [facility];  Method used: Electronically to [facility] [phone]*, [address] [place], [place]  [address], Ph: [phone], Fax: [phone] Allergies: Added new allergy or adverse reaction of * BLUE MEDS Observations: Added new observation of ALLERGY REV: Done (09/26/2009 15:35)    Prescriptions: PRILOSEC OTC 20 MG  TBEC (OMEPRAZOLE MAGNESIUM) 1 each day 30 minutes before meal  #31 x 0   Entered by:   Doristine Church RN II   Authorized by:   Meryl Dare MD [facility]   Signed by:   Doristine Church RN II on 09/26/2009   Method used:   Electronically to        [facility] [phone]* (retail)       [address]       [place], [place]  [address]       Ph: [phone]       Fax: [phone]   RxID:   [id number] GLYCOPYRROLATE 2 MG  TABS (GLYCOPYRROLATE) BID  #60 x 11   Entered by:   Doristine Church RN II   Authorized by:   Meryl Dare MD [facility]   Signed by:   Doristine Church RN II on 09/26/2009   Method used:   Electronically to        [facility] [phone]* (retail)       [address]       [place], [place]  [address]       Ph: [phone]       Fax: [phone]   RxID:   [id number]

## 2010-10-10 NOTE — Assessment & Plan Note (Signed)
Summary: follow up EGD/sheri   History of Present Illness Visit Type: Follow-up Visit Primary GI MD: Elie Goody MD [facility] Primary Provider: Sanda Linger, MD Requesting Provider: n/a Chief Complaint: F/U after ECL. Pt states her BM's are more formed now and denies diarrhea. She also has changed her diet and avoids spicy foods and this has helped with abdominal pain.  History of Present Illness:   This is a 35 year old female who returns for followup after a normal colonoscopy and normal upper endoscopy, including, biopsies. She has had an excellent response to glycopyrrolate, omeprazole and Align.   GI Review of Systems      Denies abdominal pain, acid reflux, belching, bloating, chest pain, dysphagia with liquids, dysphagia with solids, heartburn, loss of appetite, nausea, vomiting, vomiting blood, weight loss, and  weight gain.        Denies anal fissure, black tarry stools, change in bowel habit, constipation, diarrhea, diverticulosis, fecal incontinence, heme positive stool, hemorrhoids, irritable bowel syndrome, jaundice, light color stool, liver problems, rectal bleeding, and  rectal pain.   Current Medications (verified): 1)  Xanax 0.5 Mg Tabs (Alprazolam) .... One Tablet By Mouth As Needed 2)  Celexa 20 Mg Tabs (Citalopram Hydrobromide) .... One Tablet By Mouth Once Daily 3)  Glycopyrrolate 2 Mg  Tabs (Glycopyrrolate) .... One Tablet By Mouth Two Times A Day 4)  Prilosec Otc 20 Mg  Tbec (Omeprazole Magnesium) .Marland Kitchen.. 1 Each Day 30 Minutes Before Meal 5)  Align  Caps (Probiotic Product) .... One Capsule By Mouth Once Daily  Allergies (verified): 1)  ! Keflex 2)  ! * Blueberries  Past History:  Past Medical History: Reviewed history from 09/12/2009 and no changes required. Anemia fe deficency IBS Migraine HA's Endometriosis Anxiety Disorder  Past Surgical History: Reviewed history from 09/12/2009 and no changes required. Hysterectomy, partial 2006 Tubal ligation  2005 D & C 1990 surgical removal of Norplant  Family History: Reviewed history from 09/12/2009 and no changes required. Family History of Alcoholism/Addiction Family History Depression Family History Hypertension Family History of Stroke F 1st degree relative <60 Family History of Breast Cancer: Mat. Aunt No FH of Colon Cancer: Family History or Renal Cancer: Mother Family History of Diabetes: both sides of family  Social History: Reviewed history from 09/12/2009 and no changes required. Divorced--engaged 1 boy, 3 girls Current Smoker Alcohol use-occ  Regular exercise-yes Unemployed Daily Caffeine Use 2-3 drinks daily Illicit Drug Use - yes  Review of Systems  The patient denies allergy/sinus, anemia, anxiety-new, arthritis/joint pain, back pain, blood in urine, breast changes/lumps, change in vision, confusion, cough, coughing up blood, depression-new, fainting, fatigue, fever, headaches-new, hearing problems, heart murmur, heart rhythm changes, itching, menstrual pain, muscle pains/cramps, night sweats, nosebleeds, pregnancy symptoms, shortness of breath, skin rash, sleeping problems, sore throat, swelling of feet/legs, swollen lymph glands, thirst - excessive , urination - excessive , urination changes/pain, urine leakage, vision changes, and voice change.    Vital Signs:  Patient profile:   35 year old female Height:      63 inches Weight:      112 pounds BMI:     19.91 BSA:     1.51 Pulse rate:   76 / minute Pulse rhythm:   regular BP sitting:   118 / 64  (right arm)  Vitals Entered By: Hortense Ramal CMA Duncan Dull) (November 03, 2009 9:05 AM)  Physical Exam  General:  Well developed, well nourished, no acute distress. Head:  Normocephalic and atraumatic. Eyes:  PERRLA, no icterus.  Mouth:  No deformity or lesions, dentition normal. Lungs:  Clear throughout to auscultation. Heart:  Regular rate and rhythm; no murmurs, rubs,  or bruits. Abdomen:  Soft, nontender and  nondistended. No masses, hepatosplenomegaly or hernias noted. Normal bowel sounds. Psych:  Alert and cooperative. Normal mood and affect.  Impression & Recommendations:  Problem # 1:  IRRITABLE BOWEL SYNDROME (ICD-564.1) Continue glycopyrrolate 2 mg twice daily as needed. Continue Align for one more month and then she may use it if she feels it is helpful. Change Prilosec to use p.r.n.  Patient Instructions: 1)  Please continue current medications.  2)  Please schedule a follow-up appointment in 1 year. 3)  The medication list was reviewed and reconciled.  All changed / newly prescribed medications were explained.  A complete medication list was provided to the patient / caregiver.

## 2010-10-10 NOTE — Miscellaneous (Signed)
Summary: change allergies to blueberries, not blue pill  Clinical Lists Changes  Allergies: Changed allergy or adverse reaction from * BLUE MEDS to * BLUEBERRIES Observations: Added new observation of ALLERGY REV: Done (09/26/2009 15:39)

## 2010-10-10 NOTE — Letter (Signed)
Summary: [facility] Instructions  [facility]  [address] [place], [place] [address]   Phone: [phone]  Fax: [phone]       Laura Hudson    Jan 29, 1976    MRN: [id number]        Procedure Day /Date: Monday January 17th, 2011     Arrival Time: 1:30pm     Procedure Time: 2:30pm     Location of Procedure:                    _x _  [facility] (4th Floor)                        PREPARATION FOR COLONOSCOPY WITH MOVIPREP   Starting 5 days prior to your procedure  09/21/09 do not eat nuts, seeds, popcorn, corn, beans, peas,  salads, or any raw vegetables.  Do not take any fiber supplements (e.g. Metamucil, Citrucel, and Benefiber).  THE DAY BEFORE YOUR PROCEDURE         DATE:  09/25/09   DAY: Sunday    1.  Drink clear liquids the entire day-NO SOLID FOOD  2.  Do not drink anything colored red or purple.  Avoid juices with pulp.  No orange juice.  3.  Drink at least 64 oz. (8 glasses) of fluid/clear liquids during the day to prevent dehydration and help the prep work efficiently.  CLEAR LIQUIDS INCLUDE: Water Jello Ice Popsicles Tea (sugar ok, no milk/cream) Powdered fruit flavored drinks Coffee (sugar ok, no milk/cream) Gatorade Juice: apple, white grape, white cranberry  Lemonade Clear bullion, consomm, broth Carbonated beverages (any kind) Strained chicken noodle soup Hard Candy                             4.  In the morning, mix first dose of MoviPrep solution:    Empty 1 Pouch A and 1 Pouch B into the disposable container    Add lukewarm drinking water to the top line of the container. Mix to dissolve    Refrigerate (mixed solution should be used within 24 hrs)  5.  Begin drinking the prep at 5:00 p.m. The MoviPrep container is divided by 4 marks.   Every 15 minutes drink the solution down to the next mark (approximately 8 oz) until the full liter is complete.   6.  Follow completed prep with 16 oz of clear liquid of your  choice (Nothing red or purple).  Continue to drink clear liquids until bedtime.  7.  Before going to bed, mix second dose of MoviPrep solution:    Empty 1 Pouch A and 1 Pouch B into the disposable container    Add lukewarm drinking water to the top line of the container. Mix to dissolve    Refrigerate  THE DAY OF YOUR PROCEDURE      DATE:  09/26/09  DAY: Monday   Beginning at  9:30 a.m. (5 hours before procedure):         1. Every 15 minutes, drink the solution down to the next mark (approx 8 oz) until the full liter is complete.  2. Follow completed prep with 16 oz. of clear liquid of your choice.    3. You may drink clear liquids until   12:30pm (2 HOURS BEFORE PROCEDURE).   MEDICATION INSTRUCTIONS  Unless otherwise instructed, you should take regular prescription medications with a small sip of  water   as early as possible the morning of your procedure.         OTHER INSTRUCTIONS  You will need a responsible adult at least 35 years of age to accompany you and drive you home.   This person must remain in the waiting room during your procedure.  Wear loose fitting clothing that is easily removed.  Leave jewelry and other valuables at home.  However, you may wish to bring a book to read or  an iPod/MP3 player to listen to music as you wait for your procedure to start.  Remove all body piercing jewelry and leave at home.  Total time from sign-in until discharge is approximately 2-3 hours.  You should go home directly after your procedure and rest.  You can resume normal activities the  day after your procedure.  The day of your procedure you should not:   Drive   Make legal decisions   Operate machinery   Drink alcohol   Return to work  You will receive specific instructions about eating, activities and medications before you leave.    The above instructions have been reviewed and explained to me by   Marchelle Folks.     I fully understand and can verbalize  these instructions _____________________________ Date _________

## 2010-10-10 NOTE — Procedures (Signed)
Summary: Colonoscopy  Patient: Laura Hudson Note: All result statuses are Final unless otherwise noted.  Tests: (1) Colonoscopy (COL)   COL Colonoscopy           [facility]ONE      [facility]     [address]     [place], [place]  [address]           COLONOSCOPY PROCEDURE REPORT           PATIENT:  Laura Hudson, Laura Hudson  MR#:  [id number]     BIRTHDATE:  06-Jun-1976, 33 yrs. old  GENDER:  female           ENDOSCOPIST:  Judie Petit T. Russella Dar, MD, [facility]           PROCEDURE DATE:  09/26/2009     PROCEDURE:  Colonoscopy with biopsy     ASA CLASS:  Class II     INDICATIONS:  1) unexplained diarrhea  2) abdominal pain  3)     weight loss 4) scant hematochezia           MEDICATIONS:   Benadryl 50 mg IV, Fentanyl 100 mcg IV, Versed 10     mg IV           DESCRIPTION OF PROCEDURE:   After the risks benefits and     alternatives of the procedure were thoroughly explained, informed     consent was obtained.  Digital rectal exam was performed and     revealed no abnormalities.   The LB PCF-H180AL [id number] endoscope     was introduced through the anus and advanced to the terminal ileum     which was intubated for a short distance, without limitations.     The quality of the prep was excellent, using MoviPrep.  The     instrument was then slowly withdrawn as the colon was fully     examined.     <<PROCEDUREIMAGES>>           FINDINGS:  A normal appearing cecum, ileocecal valve, and     appendiceal orifice were identified. The ascending, hepatic     flexure, transverse, splenic flexure, descending, sigmoid colon,     and rectum appeared unremarkable. Random biopsies were obtained     throughout the colon and sent to pathology.  The terminal ileum     appeared normal. Retroflexed views in the rectum revealed no     abnormalities. The time to cecum =  3.25  minutes. The scope was     then withdrawn (time =  8.67  min) from the patient and the     procedure completed.           COMPLICATIONS:   None           ENDOSCOPIC IMPRESSION:     1) Normal colon     2) Normal terminal ileum           RECOMMENDATIONS:     1) Await pathology results     2) Upper endoscopy today           Aubrielle Stroud T. Russella Dar, MD, [facility]           CC: Etta Grandchild, MD           n.     Rosalie DoctorVenita Lick. Adrean Findlay at 09/26/2009 03:12 PM           Tenna Delaine, [phone]  Note: An exclamation mark (!) indicates a result that was  not dispersed into the flowsheet. Document Creation Date: 09/26/2009 3:12 PM _______________________________________________________________________  (1) Order result status: Final Collection or observation date-time: 09/26/2009 15:03 Requested date-time:  Receipt date-time:  Reported date-time:  Referring Physician:   Ordering Physician: Claudette Head [phone]) Specimen Source:  Source: Launa Grill Order Number: [id number] Lab site:

## 2010-12-12 LAB — URINALYSIS, ROUTINE W REFLEX MICROSCOPIC
Bilirubin Urine: NEGATIVE
Glucose, UA: NEGATIVE mg/dL
Hgb urine dipstick: NEGATIVE
Specific Gravity, Urine: 1.004 — ABNORMAL LOW (ref 1.005–1.030)

## 2010-12-12 LAB — POCT I-STAT, CHEM 8
BUN: 4 mg/dL — ABNORMAL LOW (ref 6–23)
Calcium, Ion: 1.23 mmol/L (ref 1.12–1.32)
Creatinine, Ser: 0.7 mg/dL (ref 0.4–1.2)
Glucose, Bld: 74 mg/dL (ref 70–99)
TCO2: 27 mmol/L (ref 0–100)

## 2010-12-12 LAB — CBC
HCT: 38.4 % (ref 36.0–46.0)
Hemoglobin: 12.5 g/dL (ref 12.0–15.0)
WBC: 7.7 10*3/uL (ref 4.0–10.5)

## 2010-12-12 LAB — DIFFERENTIAL
Eosinophils Relative: 1 % (ref 0–5)
Lymphocytes Relative: 29 % (ref 12–46)
Lymphs Abs: 2.2 10*3/uL (ref 0.7–4.0)
Monocytes Absolute: 0.5 10*3/uL (ref 0.1–1.0)
Monocytes Relative: 6 % (ref 3–12)

## 2010-12-12 LAB — HEMOCCULT GUIAC POC 1CARD (OFFICE): Fecal Occult Bld: NEGATIVE

## 2010-12-25 LAB — URINALYSIS, ROUTINE W REFLEX MICROSCOPIC
Nitrite: NEGATIVE
Specific Gravity, Urine: 1 — ABNORMAL LOW (ref 1.005–1.030)
Urobilinogen, UA: 0.2 mg/dL (ref 0.0–1.0)

## 2010-12-25 LAB — COMPREHENSIVE METABOLIC PANEL
AST: 15 U/L (ref 0–37)
CO2: 24 mEq/L (ref 19–32)
Calcium: 9.5 mg/dL (ref 8.4–10.5)
Creatinine, Ser: 0.58 mg/dL (ref 0.4–1.2)
GFR calc Af Amer: >60 mL/min (ref >60)
GFR calc non Af Amer: >60 mL/min (ref >60)

## 2010-12-25 LAB — CBC
MCHC: 32.6 g/dL (ref 30.0–36.0)
MCV: 89.7 fL (ref 78.0–100.0)
RBC: 4.37 MIL/uL (ref 3.87–5.11)

## 2010-12-25 LAB — DIFFERENTIAL
Eosinophils Relative: 1 % (ref 0–5)
Lymphocytes Relative: 26 % (ref 12–46)
Lymphs Abs: 3.2 10*3/uL (ref 0.7–4.0)

## 2010-12-25 LAB — PREGNANCY, URINE: Preg Test, Ur: NEGATIVE

## 2010-12-25 LAB — LIPASE, BLOOD: Lipase: 21 U/L (ref 11–59)

## 2011-01-23 NOTE — Group Therapy Note (Signed)
NAMEEILIDH, MARCANO           ACCOUNT NO.:  [id number]   MEDICAL RECORD NO.:  [id number]          PATIENT TYPE:  WOC   LOCATION:  [facility]                   FACILITY:  [facility]   PHYSICIAN:  Argentina Donovan, MD        DATE OF BIRTH:  Aug 10, 1976   DATE OF SERVICE:  05/01/2007                                  CLINIC NOTE   The patient is a 35 year old African-American female, gravida 5, para 4-  0-4-1, who had hysterectomy in September, 2006.  She has had a history  of having frequent bouts of vulvovaginitis usually due to BV, frequently  thinks she has a yeast infection.  Was in May, 2008, and had an E. coli  cystitis which was treated with ciprofloxacin.  Comes in today  complaining also of dysuria.  Also says that she has heard that her  boyfriend said he was treated for gonorrhea.  She went into an  __________ apparently which is not treated at that time.  I am going the  patient place the patient on doxycycline for 10 days and also give her a  prescription renewable for Macrobid for 1 year.  Will get a urine  culture, a GC/chlamydia probe and a wet prep.   IMPRESSION:  Dysuria with vulvar irritation.           ______________________________  Argentina Donovan, MD     PR/MEDQ  D:  05/01/2007  T:  05/02/2007  Job:  [id number]

## 2011-01-26 NOTE — H&P (Signed)
[name], WEINGART           ACCOUNT NO.:  [id number]   MEDICAL RECORD NO.:  [id number]          PATIENT TYPE:  EMS   LOCATION:  ED                           [facility]ACILITY[facility]  [facility]   PHYSICIAN:  Lorne Skeens. Hoxworth, M.D.DATE OF BIRTH:  20-Nov-1975   DATE OF ADMISSION:  05/08/2005  DATE OF DISCHARGE:                                HISTORY & PHYSICAL   CHIEF COMPLAINT:  1.  Fever.  2.  Abdominal pain.  3.  Nausea and vomiting.   HISTORY OF PRESENT ILLNESS:  [name] is a 35 year old black female who  approximately one week ago noticed the onset of some nasal congestion and  low grade fever. For the next several days she had mild cough, congestion,  low grade fever, which she attributed to a cold. Her upper respiratory  symptoms improved several days ago. She, however, continued to have fever  and had some fever and associated chills about two days ago. At that point,  she had no abdominal pain, nausea or other complaints except the fevers or  chills. No GU symptoms. She was actually scheduled for a hysterectomy  yesterday. She presented for surgery feeling essentially well but was found  to be febrile and her surgery was canceled but no further studies were  performed. Last night she began to develop abdominal pain, which she  describes initially in her right upper quadrant, now radiating down toward  the right mid abdomen. This is somewhat worse with movement. She also  developed nausea and vomited several times last night. She presented to the  emergency room early this morning. The pain is moderately severe. It is  worse with any motion. She specifically denies urinary burning or frequency.  No abnormal vaginal discharge. She has a history of irritable bowel with  occasional intermittent diarrhea and constipation and some crampy lower  abdominal pain. She had never had any previously GI studies.   PAST MEDICAL HISTORY:  1.  Laparoscopic bilateral tubal ligation.  2.   Laparoscopy for endometriosis. She is scheduled for a hysterectomy, as      above, for endometriosis.  3.  She has history of irritable bowel as above.   CURRENT MEDICATIONS:  None.   ALLERGIES:  KEFLEX, which causes a rash.   SOCIAL HISTORY:  Married. Drinks alcohol occasionally, negative cigarettes.   FAMILY HISTORY:  Mother has diabetes, hypertension, heart disease. No  history of inflammatory bowel disease.   REVIEW OF SYSTEMS:  Positive general for fevers and chills. Positive  respiratory for some continued mild nasal stuffiness, no cough. Cardiac  denies chest pain or palpitations. Abdomen, GI, GU as above. Musculoskeletal  no joint pain, extremity aching.   PHYSICAL EXAMINATION:  VITAL SIGNS:  Temperature 102.5, pulse 139,  respirations 22, blood pressure 114/68.  GENERAL:  She is a well-developed, black female, appears mildly  uncomfortable.  SKIN:  Warm and dry, no rash or infection.  HEENT:  No palpable or thyromegaly. Sclerae nonicteric, nares and oropharynx  clear.  LUNGS:  Clear to auscultation without wheezing or increased work of  breathing.  CARDIAC:  Regular tachycardia, no edema, no murmur.  ABDOMEN:  Nondistended, bowel sounds somewhat hypoactive. There is moderate  right mid abdominal tenderness with slight guarding. No rebound tenderness  or peritoneal signs. No CVA tenderness. No palpable masses or  hepatosplenomegaly.  EXTREMITIES:  No joint swelling or deformity.  NEUROLOGICAL:  Alert and oriented, mental status exam is grossly normal.   LABORATORY:  White count 11.8, hemoglobin 11.4, platelets 377. Urinalysis  shows positive leukocyte esterase, many bacteria and too numerous to count  white cells. Ultrasound of the pelvis and abdomen was obtained, which was  negative. CT shows a small amount of fluid in the pelvis. The appendix is  visualized laterally as somewhat retrocecal and appears normal. There is a  tiny bubble of gas seen around the ileocecal  valve with question of this  being extra luminal, but there is no associated inflammatory change and on  review with the radiologist, is felt that this could certainly be within the  lumen of the bowel, which is not opacified with contrast.   ASSESSMENT AND PLAN:  A 35 year old black female with one week of fever,  then 24 hours of abdominal pain, nausea and vomiting. She clearly has an UTI  on urinalysis. I suspect her symptoms may all be due to an UTI. She does not  appear to have appendicitis with a normal appearing appendix. There is  question of a tiny amount of extra luminal gas near to terminal ileum but  this is a very soft finding without any associated inflammatory changes that  I think would be responsible for a fever of 102. I am going to admit the  patient, obtain urine cultures, start her on IV Cipro and observe closely.  If she does not improve promptly, she may need repeat CT with contrast or  laparoscopy.      Lorne Skeens. Hoxworth, M.D.  Electronically Signed     BTH/MEDQ  D:  05/08/2005  T:  05/08/2005  Job:  [id number]

## 2011-01-26 NOTE — Op Note (Signed)
[name], Laura Hudson           ACCOUNT NO.:  [id number]   MEDICAL RECORD NO.:  [id number]          PATIENT TYPE:  INP   LOCATION:  9309                          [facility]   PHYSICIAN:  Phil D. Okey Dupre, M.D.     DATE OF BIRTH:  16-Apr-1976   DATE OF PROCEDURE:  06/04/2005  DATE OF DISCHARGE:                                 OPERATIVE REPORT   PROCEDURE:  Total vaginal hysterectomy.   PREOPERATIVE DIAGNOSIS:  Pending pathology report, intractable  menometrorrhagia and dysmenorrhea.   POSTOPERATIVE DIAGNOSIS:  Pending pathology report, intractable  menometrorrhagia and dysmenorrhea.   SURGEON:  Javier Glazier. Okey Dupre, M.D.   FIRST ASSISTANT:  Debbrah Alar, M.D.   ANESTHESIA:  General.   ESTIMATED BLOOD LOSS:  About 100 mL.   SPECIMEN TO PATHOLOGY:  The uterus.   POSTOPERATIVE CONDITION:  Satisfactory.   Tape, instrument, sponge, needle, reported correct at the end the procedure.   OPERATIVE FINDINGS:  Bimanual pelvic examination revealed the uterus of  normal size, shape, consistency, anterior flexed, and freely movable.   DESCRIPTION OF PROCEDURE:  Under satisfactory general anesthesia the patient  in dorsolithotomy position, the perineum and vagina were prepped and draped  in the usual sterile manner. A weighted speculum placed in the posterior  fourchette of the vagina. Anterior posterior lips of the cervix grasped with  Lahey clamps. 1% Xylocaine with 1:200,000 epinephrine was injected around  the entire circumference of the uterus and circumferential incision was made  around the entire uterus. The cul-de-sac of Riley Lam was entered by sharp  dissection and the hemostatic figure-of-eight was placed in that area and  held long. Then serially the ligaments were clamped, divided starting with  the uterosacrals which the suture was held long, the cardinals, the uterine  vessels, all of which were ligated #1 chromic catgut suture ligature. The  peritoneal cavity was then  entered below the bladder and the final pedicle  was taken from the area, divided, ligated with #1 chromic catgut suture  ligature. The uterus was then brought out posteriorly and the remaining  pedicle clamped with a Heaney clamp, divided and tied with a free tie of #1  chromic followed by a Heaney stitch of the same material. The area observed  for bleeding and none was noted, all sutures were cut short with exception  of those on the uterosacrals and figure-of-eights were used to close the  vaginal cuff.  The uterosacrals were then tied in the midline, suture cut, and Foley  catheter inserted and was draining clear amber urine at the end the  procedure. Tape, instrument, sponge, needle counts reported correct. The  patient was transferred to recovery room in satisfactory condition.           ______________________________  Javier Glazier Okey Dupre, M.D.     PDR/MEDQ  D:  06/04/2005  T:  06/05/2005  Job:  [id number]

## 2011-01-26 NOTE — H&P (Signed)
NAMEEVY, LUTTERMAN           ACCOUNT NO.:  [id number]   MEDICAL RECORD NO.:  [id number]          PATIENT TYPE:  INP   LOCATION:  9174                          [facility]   PHYSICIAN:  [place] B. Earlene Plater, M.D.  DATE OF BIRTH:  10/19/1975   DATE OF ADMISSION:  07/19/2004  DATE OF DISCHARGE:                                HISTORY & PHYSICAL   ADMISSION DIAGNOSES:  1.  39 week intrauterine pregnancy.  2.  Group B strep positive.  3.  Spontaneous rupture of membranes with moderate meconium.   HISTORY OF PRESENT ILLNESS:  A 35 year old African-American female, gravida  5, para 3, A1 at 27 plus weeks presents with contractions approximately  every 4 minutes for the last 2-3 hours. Spontaneous rupture of membranes in  the office just prior to evaluation.  Noted to have moderate meconium.  Prenatal care at [facility], [place] B. Earlene Plater, M.D., complicated by a  group B strep urinary tract infection the first trimester. Also noted at 20  weeks to have a velamentous cord insertion. Has had reassuring weekly  nonstress testing and appropriate interval growth. The last scan at 35 weeks  5 days showed 52nd percent growth, 2770 g, 6 pounds 2 ounces with normal  fluid.   PAST MEDICAL HISTORY:  Migraines, irritable bowel syndrome.   FAMILY HISTORY:  Noncontributory.   REVIEW OF SYMPTOMS:  Otherwise negative.   MEDICATIONS:  Prenatal vitamins.   ALLERGIES:  MEDICAL TAPE and questionable KEFLEX allergy.   PRENATAL LABS:  Group B strep is positive, O positive,  rubella immune,  hepatitis B, HIV, RPR, GC and chlamydia all negative. Pap smear showed  atypical squamous cells of undetermined significance with positive high risk  HPV.  Colposcopy showed no significant abnormality and plans for repeat Pap  post partum.   PHYSICAL EXAMINATION:  VITAL SIGNS:  Blood pressure 120/84, weight 162,  fetal heart rate 132.  GENERAL:  Alert in no acute distress.  SKIN:  Warm and dry no lesions.  HEART:  Regular rate and rhythm.  LUNGS:  Clear to auscultation.  ABDOMEN:  Gravid.  Fundal height appropriate. Uterus is nontender. Cervix is  2 cm dilated, 80% effaced, -1 station and vertex. The patient is grossly  ruptured with moderate meconium noted.   ASSESSMENT:  Term intrauterine pregnancy, spontaneous rupture of membranes  with group B strep positive and meconium stained fluid.   PLAN:  Admission to labor and delivery for management of labor. Physician on  call is Dr. Cherly Hensen who has been notified regarding the meconium and the  group B strep positive.     Wesl   WBD/MEDQ  D:  07/19/2004  T:  07/19/2004  Job:  [id number]

## 2011-01-26 NOTE — Group Therapy Note (Signed)
NAMEVITORIA, Hudson           ACCOUNT NO.:  [id number]   MEDICAL RECORD NO.:  [id number]          PATIENT TYPE:  WOC   LOCATION:  [facility]                   [facility]ACILITY:  [facility]   PHYSICIAN:  Tinnie Gens, MD        DATE OF BIRTH:  1976-05-29   DATE OF SERVICE:  07/12/2005                                    CLINIC NOTE   CHIEF COMPLAINT:  Postoperative follow-up and vaginal irritation.   HISTORY OF PRESENT ILLNESS:  Patient is a 35 year old gravida 5, para 4 who  underwent vaginal hysterectomy by Dr. Argentina Donovan on June 04, 2005.  Since that time she was readmitted with pelvic infection and subsequently  has been doing well at home.   She is reporting today some vaginal itching and discharge which she has  noted for the past several days.  She also has a vulvar irritation, but no  dysuria, no nausea, vomiting, fevers, or chills.   Patient is also complaining of URI.  She has been taking Tylenol Cold which  she states has not really been helping.   PHYSICAL EXAMINATION:  VITAL SIGNS:  Temperature 100.2, pulse 101, blood  pressure 121/84.  GENERAL:  She is a well-developed, well-nourished black female in no acute  distress.  ABDOMEN:  Soft, nontender, nondistended.  GENITOURINARY:  Normal external genitalia.  Vagina is pink and rugated.  There is adherent white discharge noted that is cottage cheese in  consistency.  Vaginal cuff is well healed.   IMPRESSION:  1.  Postoperative check, doing well.  2.  Yeast vaginitis.  3.  Upper respiratory infection.   PLAN:  1.  Diflucan 150 mg p.o. daily.  May repeat p.r.n. #2 with two refills.  2.  Patient was given a list of medications to try for UTI including Tavist-      D, Claritin, Claritin-D as non-drowsy alternative, and Mucinex.           ______________________________  Tinnie Gens, MD     TP/MEDQ  D:  07/12/2005  T:  07/13/2005  Job:  [id number]

## 2011-01-26 NOTE — Op Note (Signed)
Laura Hudson, Laura Hudson                     ACCOUNT NO.:  [id number]   MEDICAL RECORD NO.:  [id number]                   PATIENT TYPE:  AMB   LOCATION:  [facility]                                  [facility]   PHYSICIAN:  [place] B. Earlene Plater, M.D.               DATE OF BIRTH:  10-11-1975   DATE OF PROCEDURE:  09/01/2003  DATE OF DISCHARGE:                                 OPERATIVE REPORT   PREOPERATIVE DIAGNOSIS:  Chronic left lower quadrant pain.   POSTOPERATIVE DIAGNOSIS:  Chronic left lower quadrant pain.   PROCEDURE:  Open diagnostic laparoscopy, CO2 laser ablation of endometriosis  implants.   SURGEON:  Chester Holstein. Earlene Plater, M.D.   ANESTHESIA:  General.   FINDINGS:  Two implants of endometriosis, one along the left uterosacral  ligament 3-4 mm in diameter with associated scarring, and one in the left  ovarian fossa of similar size without scarring.   ESTIMATED BLOOD LOSS:  Less than 50 mL.   COMPLICATIONS:  None.   INDICATIONS:  Patient with a history of chronic left lower quadrant pain  over the last year.  Not necessarily been related to her menses.  The  patient presented for surgical diagnosis.  Known history of IBS and previous  ovarian cyst (which resolved on follow-up ultrasound).  The patient had  stated that treatment for IBS had not changed this particular pain and that  she did not think it was related to any change in bowel habits.   DESCRIPTION OF PROCEDURE:  The patient was taken to the operating room and  general anesthesia obtained.  She was placed in the [place] stirrups and  prepped and draped in a standard fashion.  Bladder emptied with a red rubber  catheter.  Speculum inserted and a Hulka tenaculum attached to the anterior  lip of the cervix.   Gowns and gloves were changed.  Attention turned to the abdomen.  A 10 mm  vertical infraumbilical skin fold incision was made with a knife and carried  sharply to the fascia.  The fascia was divided sharply.  The  fascia was then  elevated with Kocher clamps and the posterior sheath and peritoneum entered  sharply.  A pursestring suture of 0 Vicryl placed around the fascial defect,  Hasson cannula inserted and secured.   Pneumoperitoneum obtained with CO2 gas.  A 5 mm trocar placed in the left  lower quadrant under direct laparoscopic visualization.   Trendelenburg position obtained.  Bowel mobilized superiorly.  The pelvis  inspected with the above findings noted.  In addition, the appendix and  upper abdomen and each pelvic gutter appeared normal.  There was a small  amount of blood within the pelvis, particularly in the posterior cul-de-sac,  although after irrigation and thorough inspection of the pelvis trocar sites  and upper abdomen, no area of active bleeding was noted and this is presumed  to represent retrograde menstruation.   The two  areas of previously-identified endometriosis were again visualized  and the course of the ureter visualized and found to be away from the area  of interest.  Each implant was then destroyed with CO2 laser set at 15 watts  continuous.  Hemostasis obtained.  The depth of ablation is estimated at 3  mm.   No other implants were noted, no active bleeding, and therefore the  procedure was terminated.  The inferior port was removed and the site  inspected laparoscopically.  It was hemostatic.   The scope removed, gas released.  The Hasson cannula removed.  I inserted my  index finger through the fascial defect and snugged down the previously-  placed pursestring suture.  This obliterated the fascial defect, and no  intra-abdominal contents herniated through prior to closure.  The skin was  closed at each site with subcuticular 4-0 Vicryl.   The Hulka tenaculum was removed and the cervix was hemostatic.   The patient tolerated the procedure well.  There were no complications.  She  was taken to the recovery room awake and alert, in stable condition.  All   counts were correct per the operating room staff.                                               Gerri Spore B. Earlene Plater, M.D.    WBD/MEDQ  D:  09/01/2003  T:  09/02/2003  Job:  [id number]

## 2011-01-26 NOTE — H&P (Signed)
NAMEMELVENA, Laura Hudson           ACCOUNT NO.:  [id number]   MEDICAL RECORD NO.:  [id number]          PATIENT TYPE:  INP   LOCATION:  NA                            [facility]   PHYSICIAN:  Phil D. Okey Dupre, M.D.     DATE OF BIRTH:  July 21, 1976   DATE OF ADMISSION:  05/07/2005  DATE OF DISCHARGE:                                HISTORY & PHYSICAL   CHIEF COMPLAINT:  Increasingly heavy periods with disabling pelvic pain.   HISTORY OF PRESENT ILLNESS:  The patient is a 35 year old black female,  gravida 5, para 4-0-1-4, recently divorced, who over the past few years has  been bothered by severe pelvic pain that starts about two weeks before a  period, then goes through the period.  The period since her recent  pregnancy, which ended in November 2005, has gotten much, much heavier, and  the pain has become close to disabling, where some days you cannot get out  of bed to go to work.  She has a long history of irritable bowel syndrome  and migraines.  She is on no medication on a regular basis and has not had a  follow-up with her regular doctor recently until we saw her.   FAMILY HISTORY:  Noncontributory.   ALLERGIES:  The patient is allergic to [facility] and ADHESIVE TAPE.   MEDICATIONS:  None on a regular basis.   PAST HISTORY:  A year and a half ago she had a laparoscopy done for  superficial endometriosis, was treated with CO2 laser.  She has had no other  abdominal surgery except for a bilateral tubal ligation immediately  following her last delivery.  She has had four normal vaginal deliveries,  one D&C for a missed abortion.   REVIEW OF SYSTEMS:  GASTROINTESTINAL:  Occasional bouts of constipation  alternating with diarrhea diagnosed previously as irritable bowel syndrome.  NEUROLOGIC:  She has infrequent migraine headaches, but once she gets them  they often last several days.  Other than that, there is no significant  problem except for the present illness.   PHYSICAL  EXAMINATION:  VITAL SIGNS:  The patient is 5 feet 3 inches, weighs  147 pounds.  Blood pressure is 124/86, pulse is 90, temperature is 98.9,  respirations are 16 per minute.  GENERAL:  She is a well-developed, well-nourished black female in no acute  distress.  HEENT:  Within normal limits.  NECK:  Supple with a thyroid that is symmetrical with no dominant masses.  BACK:  Erect.  BREASTS:  Symmetrical with no dominant masses, no nipple discharge.  CARDIAC:  No murmur, normal sinus rhythm.  CHEST:  Lungs were clear to auscultation and percussion.  ABDOMEN:  Soft, flat, no masses, no organomegaly, and nontender.  GENITOURINARY:  External genitalia is normal.  BUS within normal limits.  The vagina is clean and well-rugated.  The cervix is clean, and recent Pap  smear was normal.  The uterus of normal size, shape, consistency.  The  adnexa normal.  Cul-de-sac is free.  RECTAL:  No masses, no sign of hemorrhoids.  EXTREMITIES:  Deep tendon reflexes  within normal limits.  No edema.  No  varicosities.  No pain.   IMPRESSION:  1.  Patient in good health with severe increasing menorrhagia and      dysmenorrhea, probable adenomyosis.  2.  Irritable bowel syndrome.  3.  Migraine headaches.           ______________________________  Javier Glazier. Okey Dupre, M.D.     PDR/MEDQ  D:  05/02/2005  T:  05/02/2005  Job:  [id number]

## 2011-01-26 NOTE — Discharge Summary (Signed)
Laura Hudson, Laura Hudson           ACCOUNT NO.:  [id number]   MEDICAL RECORD NO.:  [id number]          PATIENT TYPE:  INP   LOCATION:  9311                          FACILITY:  [facility]   PHYSICIAN:  Phil D. Okey Dupre, M.D.     DATE OF BIRTH:  Jan 16, 1976   DATE OF ADMISSION:  06/08/2005  DATE OF DISCHARGE:                                 DISCHARGE SUMMARY   HOSPITAL COURSE:  The patient, a 35 year old black female who underwent  total vaginal hysterectomy June 01, 2005, was readmitted because of  nausea, vomiting, and abdominal pain. She also had the intermittent  temperature up to between 100 and 101 and a wbc count of around 15,000. For  the most part of her stay here, she has been afebrile with one slight  elevation each day. CAT scan of the pelvis revealed a vague fluid collection  in the pelvis felt to be seroma or hematoma. On examination, her abdomen is  soft, flat, normally tender, without rebound or guarding, and what I would  consider normal pelvic pain for that time after surgery. She has had some  nausea and vomiting prior to admission. This may be related to her pain  medications and we will change that. We will discharge her on doxycycline  empirically and see her in the clinic in 4 days. Discharge instructions as  to activity and follow-up have been given to the patient. Obstipation has  been a problem. She had a Fleet's enema yesterday that did not have very  successful results and we will repeat that again before her discharge   DISCHARGE DIAGNOSIS:  Postoperative nausea and vomiting with low-grade  temperature.           ______________________________  Javier Glazier Okey Dupre, M.D.     PDR/MEDQ  D:  06/11/2005  T:  06/11/2005  Job:  [id number]

## 2011-01-26 NOTE — Op Note (Signed)
Laura Hudson, Laura Hudson           ACCOUNT NO.:  [id number]   MEDICAL RECORD NO.:  [id number]          PATIENT TYPE:  INP   LOCATION:  9108                          [facility]ACILITY:  [facility]   PHYSICIAN:  Lenoard Aden, M.D.DATE OF BIRTH:  02/08/1976   DATE OF PROCEDURE:  07/20/2004  DATE OF DISCHARGE:                                 OPERATIVE REPORT   PREOPERATIVE DIAGNOSES:  Desire for elective sterilization.   POSTOPERATIVE DIAGNOSES:  Desire for elective sterilization.   PROCEDURE:  Mini laparotomy, bilateral partial salpingectomy.   SURGEON:  Lenoard Aden, M.D.   ANESTHESIA:  Epidural.   ESTIMATED BLOOD LOSS:  Less than 50 mL.   COMPLICATIONS:  None.   DRAINS:  Foley.   COUNTS:  Correct.   DISPOSITION:  The patient to recovery in good condition.   DESCRIPTION OF PROCEDURE:  After being apprised of the risks and benefits of  tubal ligation including risks of anesthesia, infection, bleeding, injury to  abdominal organs, need for repair, failure risk of tubal ligation 5-06/999,  the patient was brought to the operating room where she was administered a  dosing of epidural anesthetic without complications, prepped and draped in  the usual sterile fashion. A small infraumbilical incision made, carried  down the fascia which was opened using sharp dissection, peritoneum  identified and entered sharply. The [employer] retractors were placed,  visualization of the right tube which is traced out to the fimbriated end,  grasped and a Babcock clamp to the ampullary isthmic portion of the tube.  Pomeroy tubal ligation is performed using #0 plain ties and tubal knuckle of  segment of tube.  A tubal segment is excised and sent to pathology. The same  procedure done on the left tube. Tubal lumens are visualized and cauterized,  hemostasis is noted. Fascia closed using continuous #0 Vicryl running  suture, subcutaneous tissue closed using a #0 Vicryl  running suture. The  subcutaneous  tissue closed using a #0 Vicryl suture, skin closed using  Dermabond.  The patient tolerated the procedure well and was transferred to  recovery in good condition.     RJT/MEDQ  D:  07/20/2004  T:  07/20/2004  Job:  [id number]

## 2011-01-26 NOTE — Discharge Summary (Signed)
NAMEMAICEY, BARRIENTEZ           ACCOUNT NO.:  [id number]   MEDICAL RECORD NO.:  [id number]          PATIENT TYPE:  INP   LOCATION:  9309                          [facility]ILITY:  [facility]   PHYSICIAN:  Phil D. Okey Dupre, M.D.     DATE OF BIRTH:  1975/10/05   DATE OF ADMISSION:  06/04/2005  DATE OF DISCHARGE:  06/06/2005                                 DISCHARGE SUMMARY   The patient is a 35 year old black female with intractable menorrhagia and  dysmenorrhea, underwent total vaginal hysterectomy on the day of admission,  did extremely well postoperatively with afebrile course, and was discharged  on postoperative day #2 on Vicodin, Slow Fe, and Restoril, to be seen in the  GYN clinic in two weeks.  At the time of discharge, the pathology report has  not returned, although grossly there was no sign of any malignancy.  The  patient was given discharge instructions as to activity, diet, and followup.   DISCHARGE DIAGNOSIS:  Pending pathology report.           ______________________________  Javier Glazier. Okey Dupre, M.D.     PDR/MEDQ  D:  07/11/2005  T:  07/11/2005  Job:  [id number]

## 2011-01-26 NOTE — Group Therapy Note (Signed)
Laura Hudson, HARTNER           ACCOUNT NO.:  [id number]   MEDICAL RECORD NO.:  [id number]          PATIENT TYPE:  WOC   LOCATION:  [facility]                   FACILITY:  [facility]   PHYSICIAN:  Tinnie Gens, MD        DATE OF BIRTH:  [birth year]   DATE OF SERVICE:                                    CLINIC NOTE   CHIEF COMPLAINT:  Abdominal pain.   HISTORY OF PRESENT ILLNESS:  Patient is a 35 year old para 4, who is status  post TVH for adenomyosis, who for the last 2 months has had sort of dull  crampy pain in the lower abdominal region.  She states it is worse with  heavy lifting.  The patient reports that she has pain with intercourse as  well and is a similar type pain.  The pain is mostly constant.  It does not  burry throughout the month and it has not really gotten much better despite  ibuprofen.  The patient also has some vaginal discharge.  She has a history  of frequent yeast infections.  The patient has also tried heating pads and  warm baths with no significant relief of her pain.   PHYSICAL EXAMINATION:  VITAL SIGNS:  She is afebrile, blood pressure is  124/83 and weight 140.  GENERAL: She is a well-developed, well-nourished female in no acute  distress.  ABDOMEN:  Soft, nontender and nondistended.  GENITOURINARY:  She has normal external female genitalia.  The vagina is  pink, there is a thick yellow discharge noted and the cuff is clean.  The  uterus is surgically absent.  BUS is normal.  She sort of has diffuse  tenderness throughout the lower abdomen.  The adnexa could not be well  appreciated.  I do not feel any masses. However, given the lack of uterus,  this exam was not ideal.   IMPRESSION:  1.  Pelvic pain.  2.  Status post total vaginal hysterectomy.  3.  Vaginal discharge.   PLAN:  1.  A wet prep today.  2.  Pelvic ultrasound to rule out ovarian cyst.  3.  If her ultrasound is normal and we cannot find a good cause, the patient      may need a scope to  see if she has scar tissue related to the      hysterectomy.           ______________________________  Tinnie Gens, MD     TP/MEDQ  D:  11/09/2005  T:  11/10/2005  Job:  [id number]

## 2011-01-26 NOTE — Group Therapy Note (Signed)
NAMEJOYIA, Laura Hudson           ACCOUNT NO.:  [id number]   MEDICAL RECORD NO.:  [id number]          PATIENT TYPE:  WOC   LOCATION:  [facility]                   FACILITY:  [facility]   PHYSICIAN:  Argentina Donovan, MD        DATE OF BIRTH:  08/29/1976   DATE OF SERVICE:                                    CLINIC NOTE   HISTORY OF PRESENT ILLNESS:  The patient is a 35 year old black female,  gravida 5, para 4-0-1-4, recently divorced, who over the past few years has  been bothered by severe pelvic pain that starts about 2 weeks before her  period, and goes through the period, and the period since her recent  pregnancy ended in November of 2005 has gotten much, much heavier, and the  pain has become close to disabling.  She has a long history of irritable  bowel syndrome and migraines.  She is on no medications on a regular basis  since she has not had followup with a regular doctor recently.  A year and a  half ago, she had a laparoscopy done with superficial endometriosis, and was  treated with a CO2 laser, but she has had no other abdominal surgery except  for bilateral tubal ligation immediately after the birth of her last baby.   PHYSICAL EXAMINATION:  VITAL SIGNS:  She is 5 feet 3 inches and weighs 147  pounds, and has a blood pressure of 124/86, pulse of 90 and a temperature of  98.9.  PELVIC:  External genitalia is normal.  BUS within normal limits.  The  vagina is clean and well rugated.  The cervix is clean, hypertrophied and  parous.  The uterus is small and anterior and of normal shape and  consistency.  The adnexa is normal.   IMPRESSION:  Chronic pelvic pain, probably secondary to adenomyosis.   PLAN:  Vaginal hysterectomy.  We have discussed the procedure with the  patient as well as possible complications related to injury to bowel,  urinary tract, anesthetic complications and those related to infection and  hemorrhage.  We will get the patient scheduled in the near  future.       PR/MEDQ  D:  03/22/2005  T:  03/22/2005  Job:  [id number]

## 2011-01-26 NOTE — H&P (Signed)
Laura Hudson, HAUGER                     ACCOUNT NO.:  [id number]   MEDICAL RECORD NO.:  [id number]                   PATIENT TYPE:  AMB   LOCATION:  SDC                                  [facility]   PHYSICIAN:  Inwood B. Earlene Plater, M.D.               DATE OF BIRTH:  27-Sep-1975   DATE OF ADMISSION:  DATE OF DISCHARGE:                                HISTORY & PHYSICAL   PREOPERATIVE DIAGNOSIS:  Chronic left lower quadrant abdominal pain.   INTENDED PROCEDURE:  Diagnostic laparoscopy, possible CO2 laser ablation of  endometriosis.   HISTORY OF PRESENT ILLNESS:  [name] gravida  4, para 3, A1 with over one year history of left lower quadrant pain.  Has  had left ovarian cyst which appeared consistent with a hemorrhagic ovarian  cyst on ultrasound which subsequently resolved.  At this point, ultrasound  is normal.   Describes her pain as persistent, varying in intensity from mild to severe,  lasting anywhere from two hours to two days.  Has not noticed any  relationship to menses.  Describes the pain as sharp and nonradiating.  Has  had no associated urinary symptoms.  Does have a history of IBS; however,  states that she has been treated with multiple medications for IBS and has  not changed this particular pain.  In addition, she has not noted  relationship to change in bowel habits with regard to pain she is currently  having.   MEDICATIONS:  Axert and NuvaRing.   ALLERGIES:  KEFLEX.   PAST MEDICAL HISTORY:  Migraines without aura and palpitations.   PAST OBSTETRIC HISTORY:  Vaginal delivery x3.   PAST SURGICAL HISTORY:  D&C for bleeding and TAB x1.   SOCIAL HISTORY:  No alcohol, tobacco or drugs.   FAMILY HISTORY:  Diabetes, heart disease, breast cancer, and stomach cancer.   REVIEW OF SYMPTOMS:  Otherwise negative.   PHYSICAL EXAMINATION:  GENERAL APPEARANCE:  Alert and oriented in no acute  distress.  VITAL SIGNS:  Blood pressure  110/78, temperature 99.3, weight 128.  SKIN:  Warm and dry, no lesions.  LUNGS:  Clear to auscultation.  CARDIOVASCULAR:  Regular rate and rhythm.  ABDOMEN:  Liver and spleen normal.  No hernia.  PELVIC:  Normal external genitalia, vagina and cervix normal.  Uterus  nontender and retroverted.  No adnexal masses or tenderness.   ASSESSMENT:  Persistent left lower quadrant pain without definitive  diagnosis.  Has history of irritable bowel syndrome.  I have informed the  patient the irritable bowel syndrome may or may not be related to her pain.  Differential diagnosis discussed including endometriosis, pelvic adhesions,  or other cause versus no identifiable etiology.  The patient desires  surgical diagnosis.  Operative risks discussed including infection,  bleeding, damage to bowel, bladder or surrounding organs.  All questions  answered.  The patient wishes to proceed.  Laura Hudson B. Earlene Plater, M.D.    WBD/MEDQ  D:  08/31/2003  T:  08/31/2003  Job:  [id number]

## 2011-02-17 ENCOUNTER — Inpatient Hospital Stay (INDEPENDENT_AMBULATORY_CARE_PROVIDER_SITE_OTHER)
Admission: RE | Admit: 2011-02-17 | Discharge: 2011-02-17 | Disposition: A | Discharge status: Home / Self Care | Payer: Self-pay | Source: Ambulatory Visit | Attending: Emergency Medicine | Admitting: Emergency Medicine

## 2011-02-17 DIAGNOSIS — S61209A Unspecified open wound of unspecified finger without damage to nail, initial encounter: Secondary | ICD-10-CM

## 2011-04-29 ENCOUNTER — Emergency Department (HOSPITAL_COMMUNITY)
Admission: EM | Admit: 2011-04-29 | Discharge: 2011-04-29 | Disposition: A | Discharge status: Home / Self Care | Payer: Self-pay | Attending: Emergency Medicine | Admitting: Emergency Medicine

## 2011-04-29 DIAGNOSIS — M25519 Pain in unspecified shoulder: Secondary | ICD-10-CM | POA: Insufficient documentation

## 2011-04-29 DIAGNOSIS — F411 Generalized anxiety disorder: Secondary | ICD-10-CM | POA: Insufficient documentation

## 2011-04-29 DIAGNOSIS — X500XXA Overexertion from strenuous movement or load, initial encounter: Secondary | ICD-10-CM | POA: Insufficient documentation

## 2011-04-29 DIAGNOSIS — M2569 Stiffness of other specified joint, not elsewhere classified: Secondary | ICD-10-CM | POA: Insufficient documentation

## 2011-04-29 DIAGNOSIS — Z79899 Other long term (current) drug therapy: Secondary | ICD-10-CM | POA: Insufficient documentation

## 2011-05-16 ENCOUNTER — Emergency Department (HOSPITAL_COMMUNITY)
Admission: EM | Admit: 2011-05-16 | Discharge: 2011-05-16 | Disposition: A | Discharge status: Home / Self Care | Payer: Self-pay | Attending: Emergency Medicine | Admitting: Emergency Medicine

## 2011-05-16 DIAGNOSIS — Z79899 Other long term (current) drug therapy: Secondary | ICD-10-CM | POA: Insufficient documentation

## 2011-05-16 DIAGNOSIS — R6889 Other general symptoms and signs: Secondary | ICD-10-CM | POA: Insufficient documentation

## 2011-05-16 DIAGNOSIS — R070 Pain in throat: Secondary | ICD-10-CM | POA: Insufficient documentation

## 2011-05-16 DIAGNOSIS — J3489 Other specified disorders of nose and nasal sinuses: Secondary | ICD-10-CM | POA: Insufficient documentation

## 2011-05-16 DIAGNOSIS — R059 Cough, unspecified: Secondary | ICD-10-CM | POA: Insufficient documentation

## 2011-05-16 DIAGNOSIS — R079 Chest pain, unspecified: Secondary | ICD-10-CM | POA: Insufficient documentation

## 2011-05-16 DIAGNOSIS — R05 Cough: Secondary | ICD-10-CM | POA: Insufficient documentation

## 2011-06-04 LAB — POCT I-STAT, CHEM 8
BUN: 6
Chloride: 107
Glucose, Bld: 91
HCT: 35 — ABNORMAL LOW
Potassium: 3.9

## 2011-06-04 LAB — CBC
RBC: 3.87
WBC: 10.6 — ABNORMAL HIGH

## 2011-06-04 LAB — POCT CARDIAC MARKERS
CKMB, poc: <1 — ABNORMAL LOW
Operator id: 161631
Troponin i, poc: <0.05

## 2011-06-04 LAB — DIFFERENTIAL
Lymphs Abs: 2.4
Monocytes Relative: 7
Neutro Abs: 7.3
Neutrophils Relative %: 69

## 2011-06-04 LAB — D-DIMER, QUANTITATIVE: D-Dimer, Quant: <0.22

## 2011-06-06 LAB — DIFFERENTIAL
Basophils Absolute: 0
Eosinophils Absolute: 0.1
Eosinophils Relative: 1

## 2011-06-06 LAB — POCT CARDIAC MARKERS: Operator id: 285841

## 2011-06-06 LAB — CBC
HCT: 37.6
MCHC: 35
MCV: 86.8
Platelets: 347
RDW: 15.5

## 2011-06-06 LAB — POCT I-STAT, CHEM 8
BUN: 4 — ABNORMAL LOW
Calcium, Ion: 1.08 — ABNORMAL LOW
HCT: 42
TCO2: 23

## 2011-06-06 LAB — BASIC METABOLIC PANEL
BUN: 3 — ABNORMAL LOW
Chloride: 105
Glucose, Bld: 83
Potassium: 3.1 — ABNORMAL LOW

## 2011-06-06 LAB — D-DIMER, QUANTITATIVE: D-Dimer, Quant: <0.22

## 2011-06-08 LAB — URINALYSIS, ROUTINE W REFLEX MICROSCOPIC
Glucose, UA: NEGATIVE
Ketones, ur: NEGATIVE
pH: 6

## 2011-06-08 LAB — GC/CHLAMYDIA PROBE AMP, GENITAL
Chlamydia, DNA Probe: NEGATIVE
GC Probe Amp, Genital: NEGATIVE

## 2011-06-08 LAB — WET PREP, GENITAL
Trich, Wet Prep: NONE SEEN
Yeast Wet Prep HPF POC: NONE SEEN

## 2011-06-17 ENCOUNTER — Emergency Department (HOSPITAL_COMMUNITY)
Admission: EM | Admit: 2011-06-17 | Discharge: 2011-06-18 | Disposition: A | Discharge status: Home / Self Care | Payer: Self-pay | Attending: Emergency Medicine | Admitting: Emergency Medicine

## 2011-06-17 DIAGNOSIS — R51 Headache: Secondary | ICD-10-CM | POA: Insufficient documentation

## 2011-06-17 DIAGNOSIS — F411 Generalized anxiety disorder: Secondary | ICD-10-CM | POA: Insufficient documentation

## 2011-06-17 DIAGNOSIS — K589 Irritable bowel syndrome without diarrhea: Secondary | ICD-10-CM | POA: Insufficient documentation

## 2011-06-17 DIAGNOSIS — R110 Nausea: Secondary | ICD-10-CM | POA: Insufficient documentation

## 2011-06-18 LAB — DIFFERENTIAL
Basophils Absolute: 0.1
Lymphocytes Relative: 20
Monocytes Absolute: 0.8
Monocytes Relative: 8
Neutro Abs: 7.8 — ABNORMAL HIGH
Neutrophils Relative %: 71

## 2011-06-18 LAB — URINALYSIS, ROUTINE W REFLEX MICROSCOPIC
Bilirubin Urine: NEGATIVE
Glucose, UA: NEGATIVE
Hgb urine dipstick: NEGATIVE
Ketones, ur: NEGATIVE
Protein, ur: NEGATIVE

## 2011-06-18 LAB — COMPREHENSIVE METABOLIC PANEL
Albumin: 3.9
BUN: 4 — ABNORMAL LOW
Creatinine, Ser: 0.7
Total Protein: 6.9

## 2011-06-18 LAB — CBC
HCT: 37.8
MCHC: 33.2
MCV: 86.5
Platelets: 358
RDW: 14.7

## 2011-06-19 LAB — COMPREHENSIVE METABOLIC PANEL
ALT: 16
AST: 21
Alkaline Phosphatase: 51
CO2: 28
Calcium: 8.9
Chloride: 103
GFR calc non Af Amer: >60
Glucose, Bld: 87
Potassium: 3.7
Sodium: 135
Total Bilirubin: 0.9
Total Protein: 7.2

## 2011-06-19 LAB — LIPASE, BLOOD: Lipase: 22

## 2011-06-19 LAB — URINE MICROSCOPIC-ADD ON

## 2011-06-19 LAB — URINALYSIS, ROUTINE W REFLEX MICROSCOPIC
Bilirubin Urine: NEGATIVE
Ketones, ur: NEGATIVE
Nitrite: POSITIVE — AB
Protein, ur: NEGATIVE
Urobilinogen, UA: 1
pH: 6

## 2011-06-19 LAB — CBC
Hemoglobin: 12.8
MCHC: 33.4
MCV: 86.7
Platelets: 356
RBC: 4.42
RDW: 14.7
WBC: 7.3

## 2011-06-19 LAB — DIFFERENTIAL
Basophils Absolute: 0
Basophils Relative: 1
Eosinophils Absolute: 0.1 — ABNORMAL LOW
Eosinophils Relative: 1
Lymphs Abs: 1.8
Neutrophils Relative %: 67

## 2011-06-25 LAB — CBC
Hemoglobin: 13.4
MCHC: 33.7
MCV: 86.8
RBC: 4.58
WBC: 20.5 — ABNORMAL HIGH

## 2011-06-25 LAB — RAPID STREP SCREEN (MED CTR MEBANE ONLY): Streptococcus, Group A Screen (Direct): NEGATIVE

## 2011-06-25 LAB — URINALYSIS, ROUTINE W REFLEX MICROSCOPIC
Leukocytes, UA: NEGATIVE
Nitrite: NEGATIVE
Protein, ur: 30 — AB
Specific Gravity, Urine: 1.03
Urobilinogen, UA: 1

## 2011-06-25 LAB — CULTURE, BLOOD (ROUTINE X 2)
Culture: NO GROWTH
Culture: NO GROWTH

## 2011-06-25 LAB — COMPREHENSIVE METABOLIC PANEL
AST: 33
Alkaline Phosphatase: 76
BUN: 5 — ABNORMAL LOW
CO2: 27
Chloride: 99
Creatinine, Ser: 0.73
GFR calc Af Amer: >60
GFR calc non Af Amer: >60
Potassium: 3.7
Total Bilirubin: 0.9

## 2011-06-25 LAB — DIFFERENTIAL
Basophils Absolute: 0
Basophils Relative: 0
Eosinophils Relative: 0
Lymphocytes Relative: 5 — ABNORMAL LOW
Monocytes Absolute: 1.2 — ABNORMAL HIGH

## 2011-06-25 LAB — URINE MICROSCOPIC-ADD ON

## 2011-06-25 LAB — URINE CULTURE
Colony Count: NO GROWTH
Culture: NO GROWTH

## 2011-06-27 LAB — DIFFERENTIAL
Basophils Absolute: 0
Basophils Relative: 0
Eosinophils Relative: 1
Monocytes Absolute: 0.8 — ABNORMAL HIGH
Neutro Abs: 7.2

## 2011-06-27 LAB — CBC
HCT: 36
Hemoglobin: 11.9 — ABNORMAL LOW
MCHC: 33.1
Platelets: 359
RDW: 14.3 — ABNORMAL HIGH

## 2011-06-27 LAB — URINALYSIS, ROUTINE W REFLEX MICROSCOPIC
Hgb urine dipstick: NEGATIVE
Nitrite: NEGATIVE
Protein, ur: NEGATIVE
Specific Gravity, Urine: 1.025
Urobilinogen, UA: 1

## 2011-06-27 LAB — RPR: RPR Ser Ql: NONREACTIVE

## 2011-06-27 LAB — GC/CHLAMYDIA PROBE AMP, URINE
Chlamydia, Swab/Urine, PCR: NEGATIVE
GC Probe Amp, Urine: NEGATIVE

## 2011-10-19 ENCOUNTER — Encounter (HOSPITAL_COMMUNITY): Payer: Self-pay | Admitting: *Deleted

## 2011-10-19 ENCOUNTER — Emergency Department (HOSPITAL_COMMUNITY)
Admission: EM | Admit: 2011-10-19 | Discharge: 2011-10-19 | Disposition: A | Discharge status: Home / Self Care | Payer: Self-pay | Attending: Emergency Medicine | Admitting: Emergency Medicine

## 2011-10-19 DIAGNOSIS — J351 Hypertrophy of tonsils: Secondary | ICD-10-CM | POA: Insufficient documentation

## 2011-10-19 DIAGNOSIS — J3489 Other specified disorders of nose and nasal sinuses: Secondary | ICD-10-CM | POA: Insufficient documentation

## 2011-10-19 DIAGNOSIS — J029 Acute pharyngitis, unspecified: Secondary | ICD-10-CM | POA: Insufficient documentation

## 2011-10-19 DIAGNOSIS — R197 Diarrhea, unspecified: Secondary | ICD-10-CM | POA: Insufficient documentation

## 2011-10-19 DIAGNOSIS — R1310 Dysphagia, unspecified: Secondary | ICD-10-CM | POA: Insufficient documentation

## 2011-10-19 DIAGNOSIS — R6889 Other general symptoms and signs: Secondary | ICD-10-CM | POA: Insufficient documentation

## 2011-10-19 DIAGNOSIS — M542 Cervicalgia: Secondary | ICD-10-CM | POA: Insufficient documentation

## 2011-10-19 MED ORDER — HYDROCODONE-ACETAMINOPHEN 7.5-500 MG/15ML PO SOLN: 15.0000 mL | Freq: Four times a day (QID) | ORAL | Status: AC | PRN

## 2011-10-19 MED ORDER — PREDNISONE 20 MG PO TABS
60.0000 mg | ORAL_TABLET | Freq: Once | ORAL | Status: AC
  Administered 2011-10-19: 60 mg via ORAL
  Filled 2011-10-19: qty 3

## 2011-10-19 NOTE — ED Notes (Signed)
Pt. has 2 week hx. Of sore throat and today has developed "white sores" on the right side of her throat.  Pt. Has c/o pain with palpation.  Pt. reports that "none of the over-the-counter medications are helping.'

## 2011-10-19 NOTE — ED Provider Notes (Signed)
History     CSN: [id number]  Arrival date & time 10/19/11  0854   First MD Initiated Contact with Patient 10/19/11 [phone]      No chief complaint on file.   (Consider location/radiation/quality/duration/timing/severity/associated sxs/prior treatment) HPI  General he healthy 36 year old female presents with cold symptoms. Patient states for the past 2 weeks she has been having sore throat, nasal congestion, sneezing, and occasional diarrhea. For the past several days he notice increase throat. This morning she notices some trouble swallowing due to throat discomfort and she also noticed white plaques to the right tonsil. Patient complaining of pain to the right side of the neck. She denies fever, chills, productive cough chest pain, shortness of breath, abdominal pain, dysuria. She denies earaches.  No past medical history on file.  No past surgical history on file.  No family history on file.  History  Substance Use Topics  . Smoking status: Not on file  . Smokeless tobacco: Not on file  . Alcohol Use: Not on file    OB History    No data available      Review of Systems  All other systems reviewed and are negative.    Allergies  Cephalexin  Home Medications  No current outpatient prescriptions on file.  There were no vitals taken for this visit.  Physical Exam  Nursing note and vitals reviewed. Constitutional: She appears well-developed and well-nourished. No distress.       Awake, alert, nontoxic appearance  HENT:  Head: Atraumatic.  Right Ear: External ear normal.  Left Ear: External ear normal.       Uvula is midline. Right-sided tonsilar enlargement with exudates. No evidence of peritonsillar abscess, or edema to floor of mouth concerning for Ludwid's angina.  Eyes: Conjunctivae are normal. Right eye exhibits no discharge. Left eye exhibits no discharge.  Neck: Neck supple.  Cardiovascular: Normal rate and regular rhythm.   Pulmonary/Chest: Effort normal.  No respiratory distress. She exhibits no tenderness.  Abdominal: Soft. There is no tenderness. There is no rebound.  Musculoskeletal: She exhibits no tenderness.       ROM appears intact, no obvious focal weakness  Neurological:       Mental status and motor strength appears intact  Skin: No rash noted.  Psychiatric: She has a normal mood and affect.    ED Course  Procedures (including critical care time)  Labs Reviewed - No data to display No results found.   No diagnosis found.  Results for orders placed during the hospital encounter of 10/19/11  RAPID STREP SCREEN      Component Value Range   Streptococcus, Group A Screen (Direct) NEGATIVE  NEGATIVE    No results found.    MDM  Symptoms suggestive of viral etiology. Strep test obtained.   11:07 AM Strep test is negative. Sore throat is likely viral tonsil pharyngitis. Due to the symptoms, patient will receive pain medications and prednisone to help decrease swelling. Patient voiced understanding and agree with plan. She is able to tolerate by mouth.     Fayrene Helper, PA-C 10/19/11 1108

## 2011-10-20 NOTE — ED Provider Notes (Signed)
Medical screening examination/treatment/procedure(s) were performed by non-physician practitioner and as supervising physician I was immediately available for consultation/collaboration.  Flint Melter, MD 10/20/11 [phone]

## 2012-04-01 ENCOUNTER — Encounter (HOSPITAL_COMMUNITY): Payer: Self-pay | Admitting: Emergency Medicine

## 2012-04-01 ENCOUNTER — Emergency Department (HOSPITAL_COMMUNITY)
Admission: EM | Admit: 2012-04-01 | Discharge: 2012-04-02 | Disposition: A | Discharge status: Home / Self Care | Payer: Self-pay | Attending: Emergency Medicine | Admitting: Emergency Medicine

## 2012-04-01 DIAGNOSIS — K589 Irritable bowel syndrome without diarrhea: Secondary | ICD-10-CM | POA: Insufficient documentation

## 2012-04-01 DIAGNOSIS — F410 Panic disorder [episodic paroxysmal anxiety] without agoraphobia: Secondary | ICD-10-CM | POA: Insufficient documentation

## 2012-04-01 DIAGNOSIS — F419 Anxiety disorder, unspecified: Secondary | ICD-10-CM | POA: Insufficient documentation

## 2012-04-01 DIAGNOSIS — R197 Diarrhea, unspecified: Secondary | ICD-10-CM | POA: Insufficient documentation

## 2012-04-01 DIAGNOSIS — G43909 Migraine, unspecified, not intractable, without status migrainosus: Secondary | ICD-10-CM | POA: Insufficient documentation

## 2012-04-01 DIAGNOSIS — R109 Unspecified abdominal pain: Secondary | ICD-10-CM | POA: Insufficient documentation

## 2012-04-01 HISTORY — DX: Anxiety disorder, unspecified: F41.9

## 2012-04-01 HISTORY — DX: Panic disorder (episodic paroxysmal anxiety): F41.0

## 2012-04-01 HISTORY — DX: Migraine, unspecified, not intractable, without status migrainosus: G43.909

## 2012-04-01 HISTORY — DX: Irritable bowel syndrome, unspecified: K58.9

## 2012-04-01 LAB — CBC WITH DIFFERENTIAL/PLATELET
Basophils Absolute: 0 10*3/uL (ref 0.0–0.1)
HCT: 39.9 % (ref 36.0–46.0)
Hemoglobin: 13.5 g/dL (ref 12.0–15.0)
Lymphocytes Relative: 30 % (ref 12–46)
Lymphs Abs: 3.1 10*3/uL (ref 0.7–4.0)
Monocytes Absolute: 0.7 10*3/uL (ref 0.1–1.0)
Monocytes Relative: 7 % (ref 3–12)
Neutro Abs: 6.4 10*3/uL (ref 1.7–7.7)
RBC: 4.63 MIL/uL (ref 3.87–5.11)
WBC: 10.3 10*3/uL (ref 4.0–10.5)

## 2012-04-01 LAB — URINALYSIS, ROUTINE W REFLEX MICROSCOPIC
Glucose, UA: NEGATIVE mg/dL
Hgb urine dipstick: NEGATIVE
pH: 6 (ref 5.0–8.0)

## 2012-04-01 LAB — COMPREHENSIVE METABOLIC PANEL
AST: 15 U/L (ref 0–37)
CO2: 23 mEq/L (ref 19–32)
Chloride: 105 mEq/L (ref 96–112)
Creatinine, Ser: 0.62 mg/dL (ref 0.50–1.10)
GFR calc non Af Amer: >90 mL/min (ref >90)
Glucose, Bld: 80 mg/dL (ref 70–99)
Total Bilirubin: 0.6 mg/dL (ref 0.3–1.2)

## 2012-04-01 LAB — URINE MICROSCOPIC-ADD ON

## 2012-04-01 MED ORDER — SODIUM CHLORIDE 0.9 % IV BOLUS (SEPSIS)
1000.0000 mL | Freq: Once | INTRAVENOUS | Status: AC
  Administered 2012-04-01: 1000 mL via INTRAVENOUS

## 2012-04-01 MED ORDER — FENTANYL CITRATE 0.05 MG/ML IJ SOLN
50.0000 ug | Freq: Once | INTRAMUSCULAR | Status: AC
  Administered 2012-04-01: 50 ug via INTRAVENOUS
  Filled 2012-04-01: qty 2

## 2012-04-01 MED ORDER — PANTOPRAZOLE SODIUM 40 MG IV SOLR
40.0000 mg | Freq: Once | INTRAVENOUS | Status: AC
  Administered 2012-04-01: 40 mg via INTRAVENOUS
  Filled 2012-04-01: qty 40

## 2012-04-01 MED ORDER — ONDANSETRON HCL 4 MG/2ML IJ SOLN
4.0000 mg | Freq: Once | INTRAMUSCULAR | Status: AC
  Administered 2012-04-01: 4 mg via INTRAVENOUS
  Filled 2012-04-01: qty 2

## 2012-04-01 NOTE — ED Notes (Signed)
Patient complaining of abdominal pain, nausea, vomiting, and diarrhea since Sunday.  Patient also reports rectal bleeding from constant diarrhea.  Patient reports history of IBS and states that she has been taking her medication; states that it has provided little to no relief.

## 2012-04-02 LAB — LIPASE, BLOOD: Lipase: 28 U/L (ref 11–59)

## 2012-04-02 LAB — PREGNANCY, URINE: Preg Test, Ur: NEGATIVE

## 2012-04-02 MED ORDER — HYDROCODONE-ACETAMINOPHEN 7.5-500 MG/15ML PO SOLN: 15.0000 mL | Freq: Four times a day (QID) | ORAL | Status: AC | PRN

## 2012-04-02 MED ORDER — PROMETHAZINE HCL 25 MG PO TABS: 25.0000 mg | ORAL_TABLET | Freq: Four times a day (QID) | ORAL | Status: DC | PRN

## 2012-04-02 MED ORDER — ONDANSETRON HCL 4 MG/2ML IJ SOLN
4.0000 mg | Freq: Once | INTRAMUSCULAR | Status: AC
  Administered 2012-04-02: 4 mg via INTRAVENOUS
  Filled 2012-04-02: qty 2

## 2012-04-02 MED ORDER — CIPROFLOXACIN HCL 500 MG PO TABS: 500.0000 mg | ORAL_TABLET | Freq: Two times a day (BID) | ORAL | Status: AC

## 2012-04-02 NOTE — ED Provider Notes (Signed)
History     CSN: [id number]  Arrival date & time 04/01/12  1847   First MD Initiated Contact with Patient 04/01/12 2306      Chief Complaint  Patient presents with  . Diarrhea  . Abdominal Pain    (Consider location/radiation/quality/duration/timing/severity/associated sxs/prior treatment) HPI History provided by patient. Having diarrhea and abdominal cramping for last 3 days. She believes this started after eating BBQ.  Some nausea no vomiting. No F/C. Some blood in stools, no PCP. No recent travel. No recent ABx.  Mild ABd pain now. Last BW around 2pm today. No rash. No sick contacts. MOD in severity.  Past Medical History  Diagnosis Date  . IBS (irritable bowel syndrome)   . Anxiety   . Panic disorder   . Migraines     History reviewed. No pertinent past surgical history.  History reviewed. No pertinent family history.  History  Substance Use Topics  . Smoking status: Current Some Day Smoker -- 0.5 packs/day  . Smokeless tobacco: Not on file  . Alcohol Use: Yes     Occassional Use    OB History    Grav Para Term Preterm Abortions TAB SAB Ect Mult Living                  Review of Systems  Constitutional: Negative for fever and chills.  HENT: Negative for neck pain and neck stiffness.   Eyes: Negative for pain.  Respiratory: Negative for cough and shortness of breath.   Cardiovascular: Negative for chest pain.  Gastrointestinal: Negative for vomiting and abdominal distention.  Genitourinary: Negative for dysuria.  Musculoskeletal: Negative for back pain.  Skin: Negative for rash.  Neurological: Negative for headaches.  All other systems reviewed and are negative.    Allergies  Cephalexin  Home Medications   Current Outpatient Rx  Name Route Sig Dispense Refill  . ALPRAZOLAM 1 MG PO TABS Oral Take 1 mg by mouth 2 (two) times daily as needed. anxiety    . DICYCLOMINE HCL 10 MG PO CAPS Oral Take 10 mg by mouth 3 (three) times daily.      BP 119/73   Pulse 70  Temp 99 F (37.2 C) (Oral)  Resp 16  SpO2 99%  LMP 10/03/2011  Physical Exam  Constitutional: She is oriented to person, place, and time. She appears well-developed and well-nourished.  HENT:  Head: Normocephalic and atraumatic.       Mildly dry mm  Eyes: Conjunctivae and EOM are normal. Pupils are equal, round, and reactive to light.  Neck: Trachea normal. Neck supple. No thyromegaly present.  Cardiovascular: Normal rate, regular rhythm, S1 normal, S2 normal and normal pulses.     No systolic murmur is present   No diastolic murmur is present  Pulses:      Radial pulses are 2+ on the right side, and 2+ on the left side.  Pulmonary/Chest: Effort normal and breath sounds normal. She has no wheezes. She has no rhonchi. She has no rales. She exhibits no tenderness.  Abdominal: Soft. Normal appearance and bowel sounds are normal. She exhibits no distension and no mass. There is no tenderness. There is no rebound, no guarding, no CVA tenderness and negative Murphy's sign.  Musculoskeletal:       BLE:s Calves nontender, no cords or erythema, negative Homans sign  Neurological: She is alert and oriented to person, place, and time. She has normal strength. No cranial nerve deficit or sensory deficit. GCS eye subscore is 4. GCS  verbal subscore is 5. GCS motor subscore is 6.  Skin: Skin is warm and dry. No rash noted. She is not diaphoretic.  Psychiatric: Her speech is normal.       Cooperative and appropriate    ED Course  Procedures (including critical care time)   Results for orders placed during the hospital encounter of 04/01/12  URINALYSIS, ROUTINE W REFLEX MICROSCOPIC      Component Value Range   Color, Urine AMBER (*) YELLOW   APPearance CLOUDY (*) CLEAR   Specific Gravity, Urine 1.025  1.005 - 1.030   pH 6.0  5.0 - 8.0   Glucose, UA NEGATIVE  NEGATIVE mg/dL   Hgb urine dipstick NEGATIVE  NEGATIVE   Bilirubin Urine SMALL (*) NEGATIVE   Ketones, ur 15 (*) NEGATIVE  mg/dL   Protein, ur NEGATIVE  NEGATIVE mg/dL   Urobilinogen, UA 1.0  0.0 - 1.0 mg/dL   Nitrite NEGATIVE  NEGATIVE   Leukocytes, UA TRACE (*) NEGATIVE  CBC WITH DIFFERENTIAL      Component Value Range   WBC 10.3  4.0 - 10.5 K/uL   RBC 4.63  3.87 - 5.11 MIL/uL   Hemoglobin 13.5  12.0 - 15.0 g/dL   HCT 16.1  09.6 - 04.5 %   MCV 86.2  78.0 - 100.0 fL   MCH 29.2  26.0 - 34.0 pg   MCHC 33.8  30.0 - 36.0 g/dL   RDW 40.9  81.1 - 91.4 %   Platelets 323  150 - 400 K/uL   Neutrophils Relative 62  43 - 77 %   Neutro Abs 6.4  1.7 - 7.7 K/uL   Lymphocytes Relative 30  12 - 46 %   Lymphs Abs 3.1  0.7 - 4.0 K/uL   Monocytes Relative 7  3 - 12 %   Monocytes Absolute 0.7  0.1 - 1.0 K/uL   Eosinophils Relative 1  0 - 5 %   Eosinophils Absolute 0.1  0.0 - 0.7 K/uL   Basophils Relative 0  0 - 1 %   Basophils Absolute 0.0  0.0 - 0.1 K/uL  COMPREHENSIVE METABOLIC PANEL      Component Value Range   Sodium 141  135 - 145 mEq/L   Potassium 3.6  3.5 - 5.1 mEq/L   Chloride 105  96 - 112 mEq/L   CO2 23  19 - 32 mEq/L   Glucose, Bld 80  70 - 99 mg/dL   BUN 8  6 - 23 mg/dL   Creatinine, Ser 7.82  0.50 - 1.10 mg/dL   Calcium 9.6  8.4 - 95.6 mg/dL   Total Protein 7.8  6.0 - 8.3 g/dL   Albumin 4.5  3.5 - 5.2 g/dL   AST 15  0 - 37 U/L   ALT 12  0 - 35 U/L   Alkaline Phosphatase 60  39 - 117 U/L   Total Bilirubin 0.6  0.3 - 1.2 mg/dL   GFR calc non Af Amer >90  >90 mL/min   GFR calc Af Amer >90  >90 mL/min  URINE MICROSCOPIC-ADD ON      Component Value Range   Squamous Epithelial / LPF MANY (*) RARE   WBC, UA 0-2  <3 WBC/hpf   Bacteria, UA FEW (*) RARE   Urine-Other MUCOUS PRESENT     Diarrhea x3 days.  IVFs, fentanyl, zofran.   Labs and UA reviewed as above. No UTI symptoms, urine culture sent. No leukocytosis. Ketones in urine. Normal LFTs.  Recheck at 0010 AM- feeling better, exam unchanged, feels comfortable for discharge home. RX provided and plan f/u in ED (no PCP).  No indication for  imaging at this time based on presentation as above.   MDM   Nursing notes reviewed. Vital signs reviewed. No tachycardia or hypotension. Afebrile. Labs and urine reviewed as above. IV fluids provided. IV narcotics provided.        Sunnie Nielsen, MD 04/02/12 [phone]

## 2012-04-03 LAB — URINE CULTURE: Culture: NO GROWTH

## 2012-05-22 ENCOUNTER — Encounter (HOSPITAL_COMMUNITY): Payer: Self-pay | Admitting: *Deleted

## 2012-05-22 ENCOUNTER — Inpatient Hospital Stay (HOSPITAL_COMMUNITY)
Admission: AD | Admit: 2012-05-22 | Discharge: 2012-05-22 | Disposition: A | Discharge status: Home / Self Care | Payer: 59 | Source: Ambulatory Visit | Attending: Obstetrics & Gynecology | Admitting: Obstetrics & Gynecology

## 2012-05-22 DIAGNOSIS — A499 Bacterial infection, unspecified: Secondary | ICD-10-CM | POA: Insufficient documentation

## 2012-05-22 DIAGNOSIS — N760 Acute vaginitis: Secondary | ICD-10-CM

## 2012-05-22 DIAGNOSIS — R109 Unspecified abdominal pain: Secondary | ICD-10-CM | POA: Insufficient documentation

## 2012-05-22 DIAGNOSIS — B9689 Other specified bacterial agents as the cause of diseases classified elsewhere: Secondary | ICD-10-CM

## 2012-05-22 LAB — URINALYSIS, ROUTINE W REFLEX MICROSCOPIC
Hgb urine dipstick: NEGATIVE
Leukocytes, UA: NEGATIVE
Specific Gravity, Urine: <1.005 — ABNORMAL LOW (ref 1.005–1.030)
Urobilinogen, UA: 0.2 mg/dL (ref 0.0–1.0)

## 2012-05-22 LAB — WET PREP, GENITAL
Trich, Wet Prep: NONE SEEN
Yeast Wet Prep HPF POC: NONE SEEN

## 2012-05-22 MED ORDER — ONDANSETRON HCL 4 MG PO TABS: 4.0000 mg | ORAL_TABLET | Freq: Two times a day (BID) | ORAL | Status: AC | PRN

## 2012-05-22 MED ORDER — METRONIDAZOLE 500 MG PO TABS: 500.0000 mg | ORAL_TABLET | Freq: Two times a day (BID) | ORAL | Status: AC

## 2012-05-22 NOTE — MAU Provider Note (Signed)
History     CSN: [id number]  Arrival date and time: 05/22/12 1240   First Provider Initiated Contact with Patient 05/22/12 1500      Chief Complaint  Patient presents with  . Abdominal Pain  . Vaginal Bleeding   HPI Laura Hudson is 36 y.o. [id number] saw a little blood on a pantyliner today at school.  Hysterectomy 2006- Dr Okey Dupre.  Last intercourse last week.  Hx IBS.  Had a bowel movement.  Is not sure where the spotting is coming from.  Has pain on the left side intermittently from the IBS.  1 partner-married.    Past Medical History  Diagnosis Date  . IBS (irritable bowel syndrome)   . Anxiety   . Panic disorder   . Migraines     Past Surgical History  Procedure Date  . Abdominal hysterectomy   . Dilation and curettage of uterus   . Laparoscopy     History reviewed. No pertinent family history.  History  Substance Use Topics  . Smoking status: Current Some Day Smoker -- 0.5 packs/day  . Smokeless tobacco: Not on file  . Alcohol Use: Yes     Occassional Use    Allergies:  Allergies  Allergen Reactions  . Cephalexin     REACTION: Rash    Prescriptions prior to admission  Medication Sig Dispense Refill  . ALPRAZolam (XANAX) 1 MG tablet Take 1 mg by mouth 2 (two) times daily as needed. anxiety      . dicyclomine (BENTYL) 10 MG capsule Take 10 mg by mouth 3 (three) times daily.      . promethazine (PHENERGAN) 25 MG tablet Take 1 tablet (25 mg total) by mouth every 6 (six) hours as needed for nausea.  30 tablet  0    Review of Systems  Constitutional: Negative.   HENT: Negative.   Respiratory: Negative.   Cardiovascular: Negative.   Gastrointestinal: Positive for abdominal pain (left sided pain hx of IBS).  Genitourinary: Positive for frequency. Negative for urgency and hematuria.       + spotting    Physical Exam   Blood pressure 122/82, pulse 81, temperature 98.2 F (36.8 C), temperature source Oral, resp. rate 16, height 5' 2.25" (1.581 m), weight  58.695 kg (129 lb 6.4 oz), last menstrual period 10/03/2011, SpO2 100.00%.  Physical Exam  Constitutional: She is oriented to person, place, and time. She appears well-developed and well-nourished. She appears distressed.  HENT:  Head: Normocephalic.  Respiratory: Effort normal.  GI: Soft. There is no tenderness. There is no rebound and no guarding.  Genitourinary: Uterus is not enlarged and not tender. Cervix exhibits no motion tenderness, no discharge and no friability. Right adnexum displays no mass, no tenderness and no fullness. Left adnexum displays no mass, no tenderness and no fullness. No bleeding around the vagina. Vaginal discharge (white discharge without odor) found.  Neurological: She is alert and oriented to person, place, and time.  Skin: Skin is warm and dry.  Psychiatric: She has a normal mood and affect. Her behavior is normal.   Results for orders placed during the hospital encounter of 05/22/12 (from the past 24 hour(s))  URINALYSIS, ROUTINE W REFLEX MICROSCOPIC     Status: Abnormal   Collection Time   05/22/12  1:24 PM      Component Value Range   Color, Urine YELLOW  YELLOW   APPearance CLEAR  CLEAR   Specific Gravity, Urine <1.005 (*) 1.005 - 1.030   pH 5.5  5.0 - 8.0   Glucose, UA NEGATIVE  NEGATIVE mg/dL   Hgb urine dipstick NEGATIVE  NEGATIVE   Bilirubin Urine NEGATIVE  NEGATIVE   Ketones, ur NEGATIVE  NEGATIVE mg/dL   Protein, ur NEGATIVE  NEGATIVE mg/dL   Urobilinogen, UA 0.2  0.0 - 1.0 mg/dL   Nitrite NEGATIVE  NEGATIVE   Leukocytes, UA NEGATIVE  NEGATIVE  WET PREP, GENITAL     Status: Abnormal   Collection Time   05/22/12  3:33 PM      Component Value Range   Yeast Wet Prep HPF POC NONE SEEN  NONE SEEN   Trich, Wet Prep NONE SEEN  NONE SEEN   Clue Cells Wet Prep HPF POC MANY (*) NONE SEEN   WBC, Wet Prep HPF POC FEW (*) NONE SEEN    MAU Course  Procedures  GC/CHl to lab  MDM At time of discharge, patient asked Darel Hong, RN if she can have a Rx for  Zofran for nausea.  She has taken Flagyl before with nausea.  Will instruct to take on full stomach and take Zofran only if needed.  Assessment and Plan  A:  Bacterial vaginosis      Hx of IBS      No evidence of vaginal bleeding  P:  Rx for Flagyl 500mg  bid X 1 week.      Patient encouraged to find GI specialist when her insurance is in effect  KEY,EVE M 05/22/2012, 3:01 PM

## 2012-05-22 NOTE — MAU Provider Note (Signed)
Attestation of Attending Supervision of Advanced Practitioner (CNM/NP): Evaluation and management procedures were performed by the Advanced Practitioner under my supervision and collaboration.  I have reviewed the Advanced Practitioner's note and chart, and I agree with the management and plan.  Selwyn Reason, MD, FACOG Attending Obstetrician & Gynecologist Faculty Practice, [facility]  

## 2012-05-22 NOTE — MAU Note (Signed)
Patient states she has had a partial hysterectomy in 2006. States she went to the bathroom and had pink spotting and a slight increase in vaginal discharge. Has a history of IBS and is having a little left abdominal pain. Also has a history of ovarian cyst and is not sure what the pain is.

## 2012-05-23 LAB — GC/CHLAMYDIA PROBE AMP, GENITAL: GC Probe Amp, Genital: NEGATIVE

## 2012-09-05 ENCOUNTER — Emergency Department (HOSPITAL_COMMUNITY)
Admission: EM | Admit: 2012-09-05 | Discharge: 2012-09-05 | Disposition: A | Discharge status: Home / Self Care | Payer: 59 | Attending: Emergency Medicine | Admitting: Emergency Medicine

## 2012-09-05 ENCOUNTER — Encounter (HOSPITAL_COMMUNITY): Payer: Self-pay | Admitting: Emergency Medicine

## 2012-09-05 DIAGNOSIS — F410 Panic disorder [episodic paroxysmal anxiety] without agoraphobia: Secondary | ICD-10-CM | POA: Insufficient documentation

## 2012-09-05 DIAGNOSIS — Z8679 Personal history of other diseases of the circulatory system: Secondary | ICD-10-CM | POA: Insufficient documentation

## 2012-09-05 DIAGNOSIS — J3489 Other specified disorders of nose and nasal sinuses: Secondary | ICD-10-CM | POA: Insufficient documentation

## 2012-09-05 DIAGNOSIS — F411 Generalized anxiety disorder: Secondary | ICD-10-CM | POA: Insufficient documentation

## 2012-09-05 DIAGNOSIS — Z8719 Personal history of other diseases of the digestive system: Secondary | ICD-10-CM | POA: Insufficient documentation

## 2012-09-05 DIAGNOSIS — R059 Cough, unspecified: Secondary | ICD-10-CM | POA: Insufficient documentation

## 2012-09-05 DIAGNOSIS — Y939 Activity, unspecified: Secondary | ICD-10-CM | POA: Insufficient documentation

## 2012-09-05 DIAGNOSIS — S335XXA Sprain of ligaments of lumbar spine, initial encounter: Secondary | ICD-10-CM | POA: Insufficient documentation

## 2012-09-05 DIAGNOSIS — IMO0001 Reserved for inherently not codable concepts without codable children: Secondary | ICD-10-CM | POA: Insufficient documentation

## 2012-09-05 DIAGNOSIS — R05 Cough: Secondary | ICD-10-CM | POA: Insufficient documentation

## 2012-09-05 DIAGNOSIS — R51 Headache: Secondary | ICD-10-CM | POA: Insufficient documentation

## 2012-09-05 DIAGNOSIS — F17200 Nicotine dependence, unspecified, uncomplicated: Secondary | ICD-10-CM | POA: Insufficient documentation

## 2012-09-05 DIAGNOSIS — J329 Chronic sinusitis, unspecified: Secondary | ICD-10-CM | POA: Insufficient documentation

## 2012-09-05 DIAGNOSIS — J069 Acute upper respiratory infection, unspecified: Secondary | ICD-10-CM | POA: Insufficient documentation

## 2012-09-05 DIAGNOSIS — X58XXXA Exposure to other specified factors, initial encounter: Secondary | ICD-10-CM | POA: Insufficient documentation

## 2012-09-05 DIAGNOSIS — S39012A Strain of muscle, fascia and tendon of lower back, initial encounter: Secondary | ICD-10-CM

## 2012-09-05 DIAGNOSIS — Y929 Unspecified place or not applicable: Secondary | ICD-10-CM | POA: Insufficient documentation

## 2012-09-05 MED ORDER — OXYMETAZOLINE HCL 0.05 % NA SOLN: 2.0000 | Freq: Two times a day (BID) | NASAL | Status: DC

## 2012-09-05 MED ORDER — BENZONATATE 100 MG PO CAPS: 100.0000 mg | ORAL_CAPSULE | Freq: Three times a day (TID) | ORAL | Status: DC

## 2012-09-05 MED ORDER — MOMETASONE FUROATE 50 MCG/ACT NA SUSP: 2.0000 | Freq: Every day | NASAL | Status: DC

## 2012-09-05 MED ORDER — METHOCARBAMOL 500 MG PO TABS: 500.0000 mg | ORAL_TABLET | Freq: Two times a day (BID) | ORAL | Status: DC

## 2012-09-05 NOTE — ED Provider Notes (Signed)
History  This chart was scribed for Laura Racer, MD by Laura Hudson, ED Scribe. This patient was seen in room TR05C/TR05C and the patient's care was started at 11:04 AM.  CSN: [id number]  Arrival date & time 09/05/12  1024   First MD Initiated Contact with Patient 09/05/12 1104      Chief Complaint  Patient presents with  . URI  . Sore Throat  . Nasal Congestion    Patient is a 36 y.o. female presenting with URI. The history is provided by the patient. No language interpreter was used.  URI The primary symptoms include headaches, sore throat, cough and myalgias (right shoulder blade). Primary symptoms do not include fever, abdominal pain, nausea or vomiting. The current episode started 2 days ago. This is a new problem. The problem has been gradually worsening.  Symptoms associated with the illness include congestion. The illness is not associated with chills. The following treatments were addressed: A decongestant was not tried.    Laura Hudson is a 36 y.o. female who presents to the Emergency Department complaining of 2 to 3 days of gradual onset, gradually worsening, constant frontal pressure HA with associated congestion, non-productive cough and sore throat that she attributes to an URI. She denies taking OTC medications at home to improve symptoms. She denies having any sick contacts with similar symptoms. She also c/o right shoulder blade that radiates down her back after falling two weeks ago. The pain is worse in the morning and worse with movement. She reports taking 800 mg of ibuprofen at home with improvement in her symptoms. She denies abdominal pain, SOB, nausea and emesis as associated symptoms. She has a h/o anxiety and is a current everyday smoker and occasional alcohol user.  Past Medical History  Diagnosis Date  . IBS (irritable bowel syndrome)   . Anxiety   . Panic disorder   . Migraines     Past Surgical History  Procedure Date  . Abdominal  hysterectomy   . Dilation and curettage of uterus   . Laparoscopy     History reviewed. No pertinent family history.  History  Substance Use Topics  . Smoking status: Current Some Day Smoker -- 0.5 packs/day  . Smokeless tobacco: Not on file  . Alcohol Use: Yes     Comment: Occassional Use    OB History    Grav Para Term Preterm Abortions TAB SAB Ect Mult Living   5 4   1  1   4       Review of Systems  Constitutional: Negative for fever and chills.  HENT: Positive for congestion and sore throat.   Respiratory: Positive for cough. Negative for shortness of breath.   Gastrointestinal: Negative for nausea, vomiting and abdominal pain.  Musculoskeletal: Positive for myalgias (right shoulder blade).  Neurological: Positive for headaches. Negative for dizziness.  All other systems reviewed and are negative.    Allergies  Cephalexin and Blueberry  Home Medications   Current Outpatient Rx  Name  Route  Sig  Dispense  Refill  . ALPRAZOLAM 1 MG PO TABS   Oral   Take 1 mg by mouth 2 (two) times daily as needed. For anxiety         . VITAMIN C 1000 MG PO TABS   Oral   Take 1,000 mg by mouth daily as needed. For vitamins         . B COMPLEX-C PO TABS   Oral   Take 1 tablet by mouth  daily as needed. For vitamins         . IBUPROFEN 200 MG PO TABS   Oral   Take 800 mg by mouth every 6 (six) hours as needed. For pain         . BENZONATATE 100 MG PO CAPS   Oral   Take 1 capsule (100 mg total) by mouth every 8 (eight) hours.   21 capsule   0   . METHOCARBAMOL 500 MG PO TABS   Oral   Take 1 tablet (500 mg total) by mouth 2 (two) times daily.   20 tablet   0   . MOMETASONE FUROATE 50 MCG/ACT NA SUSP   Nasal   Place 2 sprays into the nose daily.   17 g   12   . OXYMETAZOLINE HCL 0.05 % NA SOLN   Nasal   Place 2 sprays into the nose 2 (two) times daily.   30 mL   0     Triage Vitals: BP 116/70  Pulse 102  Temp 98.2 F (36.8 C) (Oral)  Resp 16   SpO2 98%  LMP 10/03/2011  Physical Exam  Nursing note and vitals reviewed. Constitutional: She is oriented to person, place, and time. She appears well-developed and well-nourished. No distress.  HENT:  Head: Normocephalic and atraumatic.       Mild oropharynx erythema, no exudate, no tonsillar hypertrophy, maxillary tenderness to maxillary sinuses  Eyes: Conjunctivae normal and EOM are normal. Pupils are equal, round, and reactive to light.  Neck: Neck supple. No tracheal deviation present.       No meningismus signs    Cardiovascular: Normal rate and regular rhythm.   Pulmonary/Chest: Effort normal and breath sounds normal. No respiratory distress.  Musculoskeletal: Normal range of motion.       Right paraspinal muscle tenderness  Lymphadenopathy:    She has no cervical adenopathy.  Neurological: She is alert and oriented to person, place, and time.  Skin: Skin is warm and dry.  Psychiatric: She has a normal mood and affect. Her behavior is normal.    ED Course  Procedures (including critical care time)  DIAGNOSTIC STUDIES: Oxygen Saturation is 98% on room air, normal by my interpretation.    COORDINATION OF CARE: 11:21 AM- Advised pt of lab results. Discussed discharge plan which includes nasal sprays, muscle relaxant and 600 mg ibuprofen with pt and pt agreed to plan. Also advised pt to follow up if needed and pt agreed.  11:27 AM- Prescribed Afrin nasal spray, nasonex nasal spray, 500 mg robaxin tablets and 100 mg tessalon capsules   Labs Reviewed  RAPID STREP SCREEN   No results found.   1. Sinusitis   2. Acute URI   3. Strain of lumbar region       MDM  I personally performed the services described in this documentation, which was scribed in my presence. The recorded information has been reviewed and is accurate.    Laura Racer, MD 09/05/12 [phone]

## 2012-09-05 NOTE — ED Notes (Signed)
Pt c/o sinus congestion causing her headache, sore throat, constant chills, and back pain from tightness between shoulder blades down to lower back. A&Ox4, ambulatory, nad.

## 2012-09-05 NOTE — ED Notes (Signed)
Pt c/o URI and sore throat with cough and congestion

## 2013-03-20 ENCOUNTER — Emergency Department (HOSPITAL_COMMUNITY)
Admission: EM | Admit: 2013-03-20 | Discharge: 2013-03-20 | Disposition: A | Discharge status: Home / Self Care | Payer: Self-pay | Attending: Emergency Medicine | Admitting: Emergency Medicine

## 2013-03-20 ENCOUNTER — Emergency Department (HOSPITAL_COMMUNITY): Payer: Self-pay

## 2013-03-20 ENCOUNTER — Encounter (HOSPITAL_COMMUNITY): Payer: Self-pay | Admitting: Emergency Medicine

## 2013-03-20 DIAGNOSIS — F17200 Nicotine dependence, unspecified, uncomplicated: Secondary | ICD-10-CM | POA: Insufficient documentation

## 2013-03-20 DIAGNOSIS — Z90710 Acquired absence of both cervix and uterus: Secondary | ICD-10-CM | POA: Insufficient documentation

## 2013-03-20 DIAGNOSIS — R110 Nausea: Secondary | ICD-10-CM | POA: Insufficient documentation

## 2013-03-20 DIAGNOSIS — Z3202 Encounter for pregnancy test, result negative: Secondary | ICD-10-CM | POA: Insufficient documentation

## 2013-03-20 DIAGNOSIS — F410 Panic disorder [episodic paroxysmal anxiety] without agoraphobia: Secondary | ICD-10-CM | POA: Insufficient documentation

## 2013-03-20 DIAGNOSIS — Z8679 Personal history of other diseases of the circulatory system: Secondary | ICD-10-CM | POA: Insufficient documentation

## 2013-03-20 DIAGNOSIS — Z8719 Personal history of other diseases of the digestive system: Secondary | ICD-10-CM | POA: Insufficient documentation

## 2013-03-20 DIAGNOSIS — R109 Unspecified abdominal pain: Secondary | ICD-10-CM

## 2013-03-20 DIAGNOSIS — N390 Urinary tract infection, site not specified: Secondary | ICD-10-CM | POA: Insufficient documentation

## 2013-03-20 LAB — URINALYSIS, ROUTINE W REFLEX MICROSCOPIC
Glucose, UA: NEGATIVE mg/dL
Hgb urine dipstick: NEGATIVE
Protein, ur: NEGATIVE mg/dL
Specific Gravity, Urine: 1.01 (ref 1.005–1.030)
pH: 5.5 (ref 5.0–8.0)

## 2013-03-20 LAB — CBC WITH DIFFERENTIAL/PLATELET
Eosinophils Relative: 1 % (ref 0–5)
HCT: 36.5 % (ref 36.0–46.0)
Hemoglobin: 12.3 g/dL (ref 12.0–15.0)
Lymphocytes Relative: 26 % (ref 12–46)
Lymphs Abs: 3.1 10*3/uL (ref 0.7–4.0)
MCV: 87.5 fL (ref 78.0–100.0)
Monocytes Absolute: 0.6 10*3/uL (ref 0.1–1.0)
Monocytes Relative: 5 % (ref 3–12)
Neutro Abs: 8.1 10*3/uL — ABNORMAL HIGH (ref 1.7–7.7)
RBC: 4.17 MIL/uL (ref 3.87–5.11)
WBC: 12 10*3/uL — ABNORMAL HIGH (ref 4.0–10.5)

## 2013-03-20 LAB — COMPREHENSIVE METABOLIC PANEL
AST: 14 U/L (ref 0–37)
CO2: 25 mEq/L (ref 19–32)
Calcium: 9.1 mg/dL (ref 8.4–10.5)
Chloride: 107 mEq/L (ref 96–112)
Creatinine, Ser: 0.8 mg/dL (ref 0.50–1.10)
GFR calc Af Amer: >90 mL/min (ref >90)
GFR calc non Af Amer: >90 mL/min (ref >90)
Glucose, Bld: 69 mg/dL — ABNORMAL LOW (ref 70–99)
Total Bilirubin: 0.2 mg/dL — ABNORMAL LOW (ref 0.3–1.2)

## 2013-03-20 MED ORDER — DIPHENHYDRAMINE HCL 50 MG/ML IJ SOLN
12.5000 mg | Freq: Once | INTRAMUSCULAR | Status: AC
  Administered 2013-03-20: 12.5 mg via INTRAVENOUS
  Filled 2013-03-20: qty 1

## 2013-03-20 MED ORDER — HYDROMORPHONE HCL PF 1 MG/ML IJ SOLN
1.0000 mg | Freq: Once | INTRAMUSCULAR | Status: AC
  Administered 2013-03-20: 1 mg via INTRAVENOUS
  Filled 2013-03-20: qty 1

## 2013-03-20 MED ORDER — ONDANSETRON HCL 4 MG/2ML IJ SOLN
4.0000 mg | Freq: Once | INTRAMUSCULAR | Status: AC
  Administered 2013-03-20: 4 mg via INTRAVENOUS
  Filled 2013-03-20: qty 2

## 2013-03-20 MED ORDER — ONDANSETRON HCL 8 MG PO TABS: 8.0000 mg | ORAL_TABLET | Freq: Three times a day (TID) | ORAL | Status: DC | PRN

## 2013-03-20 MED ORDER — CEPHALEXIN 500 MG PO CAPS: 500.0000 mg | ORAL_CAPSULE | Freq: Four times a day (QID) | ORAL | Status: DC

## 2013-03-20 MED ORDER — SODIUM CHLORIDE 0.9 % IV SOLN
INTRAVENOUS | Status: DC
  Administered 2013-03-20: 12:00:00 via INTRAVENOUS

## 2013-03-20 MED ORDER — HYDROCODONE-ACETAMINOPHEN 5-325 MG PO TABS: 1.0000 | ORAL_TABLET | ORAL | Status: DC | PRN

## 2013-03-20 MED ORDER — IOHEXOL 300 MG/ML  SOLN
50.0000 mL | Freq: Once | INTRAMUSCULAR | Status: AC | PRN
  Administered 2013-03-20: 50 mL via ORAL

## 2013-03-20 MED ORDER — IOHEXOL 300 MG/ML  SOLN
100.0000 mL | Freq: Once | INTRAMUSCULAR | Status: AC | PRN
  Administered 2013-03-20: 100 mL via INTRAVENOUS

## 2013-03-20 NOTE — ED Provider Notes (Signed)
History    CSN: [id number] Arrival date & time 03/20/13  1029  First MD Initiated Contact with Patient 03/20/13 1131     Chief Complaint  Patient presents with  . Abdominal Pain  . Nausea   (Consider location/radiation/quality/duration/timing/severity/associated sxs/prior Treatment) HPI Comments: Laura Hudson is a 37 y.o. female who developed abdominal pain yesterday. Today she has nausea without vomiting. The pain has been waxing and waning, but worsened today after eating breakfast. She denies fever, chills, cough, shortness of breath, back, pain, weakness, or dizziness. Her bowel habits are unchanged. There's been no dysuria, urinary frequency, hematuria, or hesitancy. There are no other known modifying factors.  Patient is a 37 y.o. female presenting with abdominal pain. The history is provided by the patient.  Abdominal Pain Associated symptoms include abdominal pain.   Past Medical History  Diagnosis Date  . IBS (irritable bowel syndrome)   . Anxiety   . Panic disorder   . Migraines    Past Surgical History  Procedure Laterality Date  . Abdominal hysterectomy    . Dilation and curettage of uterus    . Laparoscopy     History reviewed. No pertinent family history. History  Substance Use Topics  . Smoking status: Current Some Day Smoker -- 0.50 packs/day  . Smokeless tobacco: Not on file  . Alcohol Use: Yes     Comment: Occassional Use   OB History   Grav Para Term Preterm Abortions TAB SAB Ect Mult Living   5 4   1  1   4      Review of Systems  Gastrointestinal: Positive for abdominal pain.  All other systems reviewed and are negative.    Allergies  Blueberry and Cephalexin  Home Medications   Current Outpatient Rx  Name  Route  Sig  Dispense  Refill  . ALPRAZolam (XANAX) 1 MG tablet   Oral   Take 0.5 mg by mouth 2 (two) times daily as needed for sleep or anxiety. For anxiety         . ibuprofen (ADVIL,MOTRIN) 800 MG tablet   Oral   Take  1,600 mg by mouth every 8 (eight) hours as needed for pain.          BP 111/68  Pulse 84  Temp(Src) 98.3 F (36.8 C) (Oral)  Resp 16  Ht 5\' 3"  (1.6 m)  Wt 126 lb (57.153 kg)  BMI 22.33 kg/m2  SpO2 100%  LMP 10/03/2011 Physical Exam  Nursing note and vitals reviewed. Constitutional: She is oriented to person, place, and time. She appears well-developed and well-nourished. She appears distressed (mild).  HENT:  Head: Normocephalic and atraumatic.  Eyes: Conjunctivae and EOM are normal. Pupils are equal, round, and reactive to light.  Neck: Normal range of motion and phonation normal. Neck supple.  Cardiovascular: Normal rate, regular rhythm and intact distal pulses.   Pulmonary/Chest: Effort normal and breath sounds normal. She exhibits no tenderness.  Abdominal: Soft. She exhibits no distension. There is tenderness (mild left upper quadrant, and left lower quadrant). There is no guarding.  Musculoskeletal: Normal range of motion.  Neurological: She is alert and oriented to person, place, and time. She has normal strength. She exhibits normal muscle tone.  Skin: Skin is warm and dry.  Psychiatric: She has a normal mood and affect. Her behavior is normal. Judgment and thought content normal.    ED Course  Procedures (including critical care time) Medications  0.9 %  sodium chloride infusion ( Intravenous  New Bag/Given 03/20/13 1220)  ondansetron (ZOFRAN) injection 4 mg (4 mg Intravenous Given 03/20/13 1206)  HYDROmorphone (DILAUDID) injection 1 mg (1 mg Intravenous Given 03/20/13 1320)  ondansetron (ZOFRAN) injection 4 mg (4 mg Intravenous Given 03/20/13 1320)  iohexol (OMNIPAQUE) 300 MG/ML solution 50 mL (50 mLs Oral Contrast Given 03/20/13 1315)  HYDROmorphone (DILAUDID) injection 1 mg (1 mg Intravenous Given 03/20/13 1504)  diphenhydrAMINE (BENADRYL) injection 12.5 mg (12.5 mg Intravenous Given 03/20/13 1504)  iohexol (OMNIPAQUE) 300 MG/ML solution 100 mL (100 mLs Intravenous  Contrast Given 03/20/13 1525)    Patient Vitals for the past 24 hrs:  BP Temp Temp src Pulse Resp SpO2 Height Weight  03/20/13 1102 111/68 mmHg 98.3 F (36.8 C) Oral 84 16 100 % 5\' 3"  (1.6 m) 126 lb (57.153 kg)   4:18 PM Reevaluation with update and discussion. After initial assessment and treatment, an updated evaluation reveals She feels better, no further c/o. Reinhardt Licausi L    Labs Reviewed  URINALYSIS, ROUTINE W REFLEX MICROSCOPIC - Abnormal; Notable for the following:    Leukocytes, UA SMALL (*)    All other components within normal limits  CBC WITH DIFFERENTIAL - Abnormal; Notable for the following:    WBC 12.0 (*)    Neutro Abs 8.1 (*)    All other components within normal limits  COMPREHENSIVE METABOLIC PANEL - Abnormal; Notable for the following:    Glucose, Bld 69 (*)    Total Bilirubin 0.2 (*)    All other components within normal limits  URINE MICROSCOPIC-ADD ON - Abnormal; Notable for the following:    Bacteria, UA MANY (*)    All other components within normal limits  URINE CULTURE  LIPASE, BLOOD  POCT PREGNANCY, URINE   Ct Abdomen Pelvis W Contrast  03/20/2013   *RADIOLOGY REPORT*  Clinical Data: Mid abdominal pain, nausea, history of IBS  CT ABDOMEN AND PELVIS WITH CONTRAST  Technique:  Multidetector CT imaging of the abdomen and pelvis was performed following the standard protocol during bolus administration of intravenous contrast.  Contrast: OMNIPAQUE IOHEXOL 300 MG/ML  SOLN  Comparison: 09/30/2008  Findings: Lung bases are clear.  Liver, spleen, pancreas, and adrenal glands are within normal limits.  Gallbladder is unremarkable.  No intrahepatic or extrahepatic ductal dilatation.  Kidneys are within normal limits.  No hydronephrosis.  No evidence of bowel obstruction.  Normal appendix.  Moderate colonic stool burden.  No evidence of abdominal aortic aneurysm.  Small volume pelvic ascites, possibly minimally complex (series 2/image 61), likely physiologic.  No  suspicious abdominopelvic lymphadenopathy.  Status post hysterectomy.  Gas within the vagina.  1.7 cm left corpus luteal cyst (series 2/image 60).  Right ovary is within normal limits.  Bladder is unremarkable.  Posterior disc bulge at L5-S1 (series 4/image 67).  IMPRESSION: No evidence of bowel obstruction.  Normal appendix.  Moderate colonic stool burden, raising the possibility of constipation.  Small volume pelvic ascites, likely physiologic.   Original Report Authenticated By: Charline Bills, M.D.   1. Abdominal pain   2. UTI (lower urinary tract infection)     MDM  Nonspecific abdominal pain, without significant findings on CT imaging. She's improved she daily. Doubt appendicitis, colitis, pyelonephritis. Urinalysis is positive for infection. Urine cultures pending. She is stable for discharge.  Nursing Notes Reviewed/ Care Coordinated, and agree without changes. Applicable Imaging Reviewed.  Interpretation of Laboratory Data incorporated into ED treatment   Plan: Home Medications- Keflex, Norco, Zofran, Colace.; Home Treatments and Observation- rest, oral  fluids; return here if the recommended treatment, does not improve the symptoms; Recommended follow up- PCP prn     Flint Melter, MD 03/20/13 1651

## 2013-03-20 NOTE — ED Notes (Addendum)
Pt states she began to have centralized abd pain yesterday am that has since gotten worse.  Pt states she is nauseous but no v/d or urinary changes. Pt states this feels different from usual IBS

## 2013-03-21 ENCOUNTER — Telehealth (HOSPITAL_COMMUNITY): Payer: Self-pay | Admitting: Emergency Medicine

## 2013-03-21 NOTE — Telephone Encounter (Signed)
Patient called requesting change in RX Keflex due to allergy and Zofran due to cost. Dr Effie Shy notified and order given for change in RX as follows: t.o. Bactrim DS one PO BID x seven days and Phenergan 25 mg PO tid prn dispense #[id number], read back and verified. New RX called to [facility] [phone] voicemail. Patient notified of changes.

## 2013-03-22 LAB — URINE CULTURE: Colony Count: >=100000

## 2013-03-23 NOTE — ED Notes (Signed)
+   Urine Patint treated with keflex-sensitive to same-chart appended per protocol MD

## 2013-06-17 ENCOUNTER — Emergency Department (HOSPITAL_COMMUNITY): Payer: 59

## 2013-06-17 ENCOUNTER — Encounter (HOSPITAL_COMMUNITY): Payer: Self-pay | Admitting: Emergency Medicine

## 2013-06-17 ENCOUNTER — Emergency Department (HOSPITAL_COMMUNITY)
Admission: EM | Admit: 2013-06-17 | Discharge: 2013-06-17 | Disposition: A | Discharge status: Home / Self Care | Payer: 59 | Attending: Emergency Medicine | Admitting: Emergency Medicine

## 2013-06-17 DIAGNOSIS — M25512 Pain in left shoulder: Secondary | ICD-10-CM

## 2013-06-17 DIAGNOSIS — S4980XA Other specified injuries of shoulder and upper arm, unspecified arm, initial encounter: Secondary | ICD-10-CM | POA: Insufficient documentation

## 2013-06-17 DIAGNOSIS — Y929 Unspecified place or not applicable: Secondary | ICD-10-CM | POA: Insufficient documentation

## 2013-06-17 DIAGNOSIS — Y9389 Activity, other specified: Secondary | ICD-10-CM | POA: Insufficient documentation

## 2013-06-17 DIAGNOSIS — Z8679 Personal history of other diseases of the circulatory system: Secondary | ICD-10-CM | POA: Insufficient documentation

## 2013-06-17 DIAGNOSIS — F17200 Nicotine dependence, unspecified, uncomplicated: Secondary | ICD-10-CM | POA: Insufficient documentation

## 2013-06-17 DIAGNOSIS — IMO0002 Reserved for concepts with insufficient information to code with codable children: Secondary | ICD-10-CM | POA: Insufficient documentation

## 2013-06-17 DIAGNOSIS — Y990 Civilian activity done for income or pay: Secondary | ICD-10-CM | POA: Insufficient documentation

## 2013-06-17 DIAGNOSIS — S46909A Unspecified injury of unspecified muscle, fascia and tendon at shoulder and upper arm level, unspecified arm, initial encounter: Secondary | ICD-10-CM | POA: Insufficient documentation

## 2013-06-17 DIAGNOSIS — F410 Panic disorder [episodic paroxysmal anxiety] without agoraphobia: Secondary | ICD-10-CM | POA: Insufficient documentation

## 2013-06-17 DIAGNOSIS — Z8719 Personal history of other diseases of the digestive system: Secondary | ICD-10-CM | POA: Insufficient documentation

## 2013-06-17 MED ORDER — METHOCARBAMOL 500 MG PO TABS: 500.0000 mg | ORAL_TABLET | Freq: Two times a day (BID) | ORAL | Status: DC

## 2013-06-17 NOTE — Progress Notes (Signed)
Orthopedic Tech Progress Note Patient Details:  Laura Hudson May 18, 1976 [id number]  Ortho Devices Type of Ortho Device: Arm sling Ortho Device/Splint Location: LUE Ortho Device/Splint Interventions: Ordered;Application   Jennye Moccasin 06/17/2013, 4:54 PM
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
## 2013-06-17 NOTE — ED Notes (Signed)
Called ortho for sling.  

## 2013-06-17 NOTE — ED Notes (Signed)
Pt states she was painting a ceiling with a roller brush on Sunday. Slipped, jerking her left arm. Now has left shoulder pain and tingling.

## 2013-06-19 NOTE — ED Provider Notes (Signed)
Medical screening examination/treatment/procedure(s) were performed by non-physician practitioner and as supervising physician I was immediately available for consultation/collaboration.   Charles B. Sheldon, MD 06/19/13 2030 

## 2014-03-17 ENCOUNTER — Encounter (HOSPITAL_COMMUNITY): Payer: Self-pay | Admitting: Emergency Medicine

## 2014-03-17 ENCOUNTER — Inpatient Hospital Stay (HOSPITAL_COMMUNITY)
Admission: EM | Admit: 2014-03-17 | Discharge: 2014-03-19 | DRG: 392 | Disposition: A | Discharge status: Home / Self Care | Payer: 59 | Attending: Internal Medicine | Admitting: Internal Medicine

## 2014-03-17 ENCOUNTER — Emergency Department (HOSPITAL_COMMUNITY): Payer: 59

## 2014-03-17 DIAGNOSIS — D649 Anemia, unspecified: Secondary | ICD-10-CM

## 2014-03-17 DIAGNOSIS — F411 Generalized anxiety disorder: Secondary | ICD-10-CM | POA: Diagnosis present

## 2014-03-17 DIAGNOSIS — R634 Abnormal weight loss: Secondary | ICD-10-CM

## 2014-03-17 DIAGNOSIS — K5289 Other specified noninfective gastroenteritis and colitis: Secondary | ICD-10-CM | POA: Diagnosis present

## 2014-03-17 DIAGNOSIS — R195 Other fecal abnormalities: Secondary | ICD-10-CM

## 2014-03-17 DIAGNOSIS — K529 Noninfective gastroenteritis and colitis, unspecified: Secondary | ICD-10-CM | POA: Diagnosis present

## 2014-03-17 DIAGNOSIS — F17200 Nicotine dependence, unspecified, uncomplicated: Secondary | ICD-10-CM | POA: Diagnosis present

## 2014-03-17 DIAGNOSIS — K5732 Diverticulitis of large intestine without perforation or abscess without bleeding: Principal | ICD-10-CM | POA: Diagnosis present

## 2014-03-17 DIAGNOSIS — F419 Anxiety disorder, unspecified: Secondary | ICD-10-CM

## 2014-03-17 DIAGNOSIS — R109 Unspecified abdominal pain: Secondary | ICD-10-CM

## 2014-03-17 DIAGNOSIS — K589 Irritable bowel syndrome without diarrhea: Secondary | ICD-10-CM

## 2014-03-17 DIAGNOSIS — Z79899 Other long term (current) drug therapy: Secondary | ICD-10-CM

## 2014-03-17 DIAGNOSIS — R197 Diarrhea, unspecified: Secondary | ICD-10-CM

## 2014-03-17 DIAGNOSIS — D72829 Elevated white blood cell count, unspecified: Secondary | ICD-10-CM

## 2014-03-17 DIAGNOSIS — F410 Panic disorder [episodic paroxysmal anxiety] without agoraphobia: Secondary | ICD-10-CM

## 2014-03-17 DIAGNOSIS — R1084 Generalized abdominal pain: Secondary | ICD-10-CM

## 2014-03-17 HISTORY — DX: Unspecified chronic bronchitis: J42

## 2014-03-17 HISTORY — DX: Unspecified asthma, uncomplicated: J45.909

## 2014-03-17 HISTORY — DX: Anemia, unspecified: D64.9

## 2014-03-17 LAB — CBC WITH DIFFERENTIAL/PLATELET
Basophils Absolute: 0.1 10*3/uL (ref 0.0–0.1)
Basophils Relative: 0 % (ref 0–1)
EOS PCT: 1 % (ref 0–5)
Eosinophils Absolute: 0.1 10*3/uL (ref 0.0–0.7)
HCT: 37.2 % (ref 36.0–46.0)
HEMOGLOBIN: 12.6 g/dL (ref 12.0–15.0)
LYMPHS ABS: 3.4 10*3/uL (ref 0.7–4.0)
LYMPHS PCT: 22 % (ref 12–46)
MCH: 29.9 pg (ref 26.0–34.0)
MCHC: 33.9 g/dL (ref 30.0–36.0)
MCV: 88.2 fL (ref 78.0–100.0)
MONOS PCT: 7 % (ref 3–12)
Monocytes Absolute: 1.1 10*3/uL — ABNORMAL HIGH (ref 0.1–1.0)
Neutro Abs: 10.9 10*3/uL — ABNORMAL HIGH (ref 1.7–7.7)
Neutrophils Relative %: 70 % (ref 43–77)
PLATELETS: 390 10*3/uL (ref 150–400)
RBC: 4.22 MIL/uL (ref 3.87–5.11)
RDW: 14.8 % (ref 11.5–15.5)
WBC: 15.6 10*3/uL — AB (ref 4.0–10.5)

## 2014-03-17 LAB — COMPREHENSIVE METABOLIC PANEL
ALK PHOS: 51 U/L (ref 39–117)
ALT: 16 U/L (ref 0–35)
ANION GAP: 15 (ref 5–15)
AST: 15 U/L (ref 0–37)
Albumin: 4.3 g/dL (ref 3.5–5.2)
BUN: 8 mg/dL (ref 6–23)
CALCIUM: 9.2 mg/dL (ref 8.4–10.5)
CO2: 22 meq/L (ref 19–32)
Chloride: 102 mEq/L (ref 96–112)
Creatinine, Ser: 0.67 mg/dL (ref 0.50–1.10)
GLUCOSE: 86 mg/dL (ref 70–99)
POTASSIUM: 3.9 meq/L (ref 3.7–5.3)
SODIUM: 139 meq/L (ref 137–147)
Total Bilirubin: 0.3 mg/dL (ref 0.3–1.2)
Total Protein: 7.3 g/dL (ref 6.0–8.3)

## 2014-03-17 LAB — URINALYSIS, ROUTINE W REFLEX MICROSCOPIC
BILIRUBIN URINE: NEGATIVE
Glucose, UA: NEGATIVE mg/dL
HGB URINE DIPSTICK: NEGATIVE
KETONES UR: NEGATIVE mg/dL
Leukocytes, UA: NEGATIVE
NITRITE: NEGATIVE
PROTEIN: NEGATIVE mg/dL
Specific Gravity, Urine: 1.007 (ref 1.005–1.030)
UROBILINOGEN UA: 0.2 mg/dL (ref 0.0–1.0)
pH: 6.5 (ref 5.0–8.0)

## 2014-03-17 MED ORDER — LORATADINE 10 MG PO TABS
10.0000 mg | ORAL_TABLET | Freq: Every day | ORAL | Status: DC
Start: 2014-03-17 — End: 2014-03-19
  Administered 2014-03-17 – 2014-03-19 (×3): 10 mg via ORAL
  Filled 2014-03-17 (×3): qty 1

## 2014-03-17 MED ORDER — HYDROMORPHONE HCL PF 1 MG/ML IJ SOLN
1.0000 mg | INTRAMUSCULAR | Status: DC | PRN
  Administered 2014-03-17 – 2014-03-19 (×10): 1 mg via INTRAVENOUS
  Filled 2014-03-17 (×10): qty 1

## 2014-03-17 MED ORDER — DIPHENHYDRAMINE HCL 25 MG PO TABS
50.0000 mg | ORAL_TABLET | Freq: Every day | ORAL | Status: DC | PRN
  Filled 2014-03-17: qty 2

## 2014-03-17 MED ORDER — DIPHENHYDRAMINE HCL 25 MG PO CAPS: 50.0000 mg | ORAL_CAPSULE | Freq: Every day | ORAL | Status: DC | PRN

## 2014-03-17 MED ORDER — IOHEXOL 300 MG/ML  SOLN
100.0000 mL | Freq: Once | INTRAMUSCULAR | Status: AC | PRN
  Administered 2014-03-17: 100 mL via INTRAVENOUS

## 2014-03-17 MED ORDER — MORPHINE SULFATE 4 MG/ML IJ SOLN
4.0000 mg | Freq: Once | INTRAMUSCULAR | Status: AC
  Administered 2014-03-17: 4 mg via INTRAVENOUS
  Filled 2014-03-17: qty 1

## 2014-03-17 MED ORDER — ONDANSETRON HCL 4 MG/2ML IJ SOLN
4.0000 mg | INTRAMUSCULAR | Status: DC | PRN
  Administered 2014-03-17 (×2): 4 mg via INTRAVENOUS
  Filled 2014-03-17 (×3): qty 2

## 2014-03-17 MED ORDER — CIPROFLOXACIN IN D5W 400 MG/200ML IV SOLN
400.0000 mg | Freq: Once | INTRAVENOUS | Status: AC
  Administered 2014-03-17: 400 mg via INTRAVENOUS
  Filled 2014-03-17: qty 200

## 2014-03-17 MED ORDER — SODIUM CHLORIDE 0.9 % IV SOLN
INTRAVENOUS | Status: DC
  Administered 2014-03-17 – 2014-03-19 (×3): via INTRAVENOUS

## 2014-03-17 MED ORDER — CIPROFLOXACIN IN D5W 400 MG/200ML IV SOLN
400.0000 mg | Freq: Two times a day (BID) | INTRAVENOUS | Status: AC
  Administered 2014-03-17 – 2014-03-18 (×4): 400 mg via INTRAVENOUS
  Filled 2014-03-17 (×4): qty 200

## 2014-03-17 MED ORDER — METRONIDAZOLE IN NACL 5-0.79 MG/ML-% IV SOLN
500.0000 mg | Freq: Three times a day (TID) | INTRAVENOUS | Status: AC
  Administered 2014-03-17 – 2014-03-19 (×6): 500 mg via INTRAVENOUS
  Filled 2014-03-17 (×6): qty 100

## 2014-03-17 MED ORDER — MORPHINE SULFATE 2 MG/ML IJ SOLN
1.0000 mg | INTRAMUSCULAR | Status: DC | PRN
  Administered 2014-03-17 (×2): 2 mg via INTRAVENOUS
  Filled 2014-03-17 (×2): qty 1

## 2014-03-17 MED ORDER — CLONAZEPAM 1 MG PO TABS
1.0000 mg | ORAL_TABLET | Freq: Two times a day (BID) | ORAL | Status: DC | PRN
  Administered 2014-03-18 – 2014-03-19 (×2): 1 mg via ORAL
  Filled 2014-03-17 (×2): qty 1

## 2014-03-17 MED ORDER — DICYCLOMINE HCL 20 MG PO TABS
20.0000 mg | ORAL_TABLET | Freq: Four times a day (QID) | ORAL | Status: DC | PRN
  Administered 2014-03-17: 20 mg via ORAL
  Filled 2014-03-17 (×2): qty 1

## 2014-03-17 MED ORDER — ENOXAPARIN SODIUM 40 MG/0.4ML ~~LOC~~ SOLN
40.0000 mg | SUBCUTANEOUS | Status: DC
  Administered 2014-03-17 – 2014-03-19 (×3): 40 mg via SUBCUTANEOUS
  Filled 2014-03-17 (×3): qty 0.4

## 2014-03-17 MED ORDER — METRONIDAZOLE IN NACL 5-0.79 MG/ML-% IV SOLN
500.0000 mg | Freq: Once | INTRAVENOUS | Status: AC
  Administered 2014-03-17: 500 mg via INTRAVENOUS
  Filled 2014-03-17: qty 100

## 2014-03-17 MED ORDER — PROMETHAZINE HCL 25 MG/ML IJ SOLN
12.5000 mg | Freq: Four times a day (QID) | INTRAMUSCULAR | Status: DC | PRN
  Administered 2014-03-17 – 2014-03-18 (×4): 12.5 mg via INTRAVENOUS
  Administered 2014-03-18: 25 mg via INTRAVENOUS
  Administered 2014-03-19 (×2): 12.5 mg via INTRAVENOUS
  Administered 2014-03-19: 25 mg via INTRAVENOUS
  Filled 2014-03-17 (×8): qty 1

## 2014-03-17 MED ORDER — SODIUM CHLORIDE 0.9 % IV BOLUS (SEPSIS)
1000.0000 mL | Freq: Once | INTRAVENOUS | Status: AC
  Administered 2014-03-17: 1000 mL via INTRAVENOUS

## 2014-03-17 MED ORDER — ONDANSETRON HCL 4 MG/2ML IJ SOLN
4.0000 mg | Freq: Once | INTRAMUSCULAR | Status: AC
  Administered 2014-03-17: 4 mg via INTRAVENOUS
  Filled 2014-03-17: qty 2

## 2014-03-17 MED ORDER — ACETAMINOPHEN 325 MG PO TABS
650.0000 mg | ORAL_TABLET | Freq: Four times a day (QID) | ORAL | Status: DC | PRN
  Administered 2014-03-17: 650 mg via ORAL
  Filled 2014-03-17: qty 2

## 2014-03-17 NOTE — ED Provider Notes (Signed)
CSN: [id number]     Arrival date & time 03/17/14  0026 History   First MD Initiated Contact with Patient 03/17/14 0302     Chief Complaint  Patient presents with  . Abdominal Pain    (Consider location/radiation/quality/duration/timing/severity/associated sxs/prior Treatment) HPI Comments: Patient is a 38 year old female with a history of IBS, anxiety, and migraines who presents to the emergency department for abdominal pain. Patient states the pain began yesterday morning and was diffusely cramping in nature. She states that 11 PM yesterday evening, the pain worsened to a severe pain in her right lower abdomen. Yesterday morning patient versus nausea with one episode of nonbloody emesis. She had one episode of nonbloody diarrhea yesterday morning which has since resolved with Imodium. Patient denies associated fever, chest pain, shortness of breath, hematemesis, melena or hematochezia, urinary symptoms, vaginal complaints, numbness, and weakness. Abdominal surgical history significant for partial abdominal hysterectomy.  Patient is a 38 y.o. female presenting with abdominal pain. The history is provided by the patient. No language interpreter was used.  Abdominal Pain Associated symptoms: diarrhea, nausea and vomiting     Past Medical History  Diagnosis Date  . IBS (irritable bowel syndrome)   . Anxiety   . Panic disorder   . Migraines    Past Surgical History  Procedure Laterality Date  . Abdominal hysterectomy    . Dilation and curettage of uterus    . Laparoscopy     History reviewed. No pertinent family history. History  Substance Use Topics  . Smoking status: Current Some Day Smoker -- 0.50 packs/day  . Smokeless tobacco: Not on file  . Alcohol Use: Yes     Comment: Occassional Use   OB History   Grav Para Term Preterm Abortions TAB SAB Ect Mult Living   5 4   1  1   4       Review of Systems  Gastrointestinal: Positive for nausea, vomiting, abdominal pain and  diarrhea. Negative for blood in stool.  All other systems reviewed and are negative.    Allergies  Blueberry and Cephalexin  Home Medications   Prior to Admission medications   Medication Sig Start Date End Date Taking? Authorizing Provider  clonazePAM (KLONOPIN) 1 MG tablet Take 1 mg by mouth 2 (two) times daily as needed for anxiety.   Yes Historical Provider, MD  dicyclomine (BENTYL) 20 MG tablet Take 20 mg by mouth every 6 (six) hours as needed for spasms.   Yes Historical Provider, MD  diphenhydrAMINE (BENADRYL) 25 MG tablet Take 50 mg by mouth daily as needed for allergies.   Yes Historical Provider, MD  fexofenadine (ALLEGRA) 180 MG tablet Take 180 mg by mouth daily.   Yes Historical Provider, MD  Pseudoeph-Doxylamine-DM-APAP (NYQUIL PO) Take 2 tablets by mouth at bedtime as needed (cold/sleep).   Yes Historical Provider, MD   BP 123/61  Pulse 78  Temp(Src) 98 F (36.7 C) (Oral)  Resp 24  Ht 5\' 3"  (1.6 m)  Wt 103 lb (46.72 kg)  BMI 18.25 kg/m2  SpO2 100%  LMP 10/03/2011  Physical Exam  Nursing note and vitals reviewed. Constitutional: She is oriented to person, place, and time. She appears well-developed and well-nourished. No distress.   Nontoxic/nonseptic appearing  HENT:  Head: Normocephalic and atraumatic.  Mouth/Throat: Oropharynx is clear and moist. No oropharyngeal exudate.  Eyes: Conjunctivae and EOM are normal. No scleral icterus.  Neck: Normal range of motion.  Cardiovascular: Normal rate, regular rhythm and normal heart sounds.  Pulmonary/Chest: Effort normal. No respiratory distress. She has no wheezes. She has no rales.  Chest expansion symmetric  Abdominal: Soft. She exhibits no distension. There is tenderness. There is no rebound and no guarding.  Abdomen soft with tenderness in right lower contract. No rebound tenderness or peritoneal signs. No guarding.  Musculoskeletal: Normal range of motion.  Neurological: She is alert and oriented to person,  place, and time. She exhibits normal muscle tone. Coordination normal.  GCS 15. Patient moves extremities without ataxia.  Skin: Skin is warm and dry. No rash noted. She is not diaphoretic. No erythema. No pallor.  Psychiatric: She has a normal mood and affect. Her behavior is normal.    ED Course  Procedures (including critical care time) Labs Review Labs Reviewed  CBC WITH DIFFERENTIAL - Abnormal; Notable for the following:    WBC 15.6 (*)    Neutro Abs 10.9 (*)    Monocytes Absolute 1.1 (*)    All other components within normal limits  COMPREHENSIVE METABOLIC PANEL  URINALYSIS, ROUTINE W REFLEX MICROSCOPIC    Imaging Review Ct Abdomen Pelvis W Contrast  03/17/2014   CLINICAL DATA:  Right lower quadrant abdominal pain beginning this morning. Nausea and diarrhea.  EXAM: CT ABDOMEN AND PELVIS WITH CONTRAST  TECHNIQUE: Multidetector CT imaging of the abdomen and pelvis was performed using the standard protocol following bolus administration of intravenous contrast.  CONTRAST:  100mL OMNIPAQUE IOHEXOL 300 MG/ML  SOLN  COMPARISON:  03/20/2013  FINDINGS: Lung bases are clear.  Liver, spleen, gallbladder, pancreas, adrenal glands, kidneys, abdominal aorta, inferior vena cava, and retroperitoneal lymph nodes are unremarkable. Stomach, small bowel, and colon are not abnormally distended. No free air or free fluid in the abdomen.  Pelvis: The appendix itself is normal but there is evidence of fluid in the right lower quadrant surrounding the cecum. The cecal wall appears to be focally thickened. This suggests an inflammatory process, possibly representing right colonic diverticulitis. Focal colitis or Crohn's disease could also have this appearance. Underlying tumor is not entirely excluded either. Small amount of free fluid in the pelvis is likely reactive or physiologic. Prominent vaginal gas. No findings to suggest diverticulitis. Involuting follicle demonstrated in the left ovary. Uterus appears to  be surgically absent. Bladder wall is not thickened. No destructive bone lesions.  IMPRESSION: Inflammatory process in the right lower quadrant with fluid around the cecum and mild cecal wall thickening. The appendix itself is normal. Diverticula are present in this area in the changes most likely due to right colonic diverticulitis. However, other inflammatory processes or neoplasm are not entirely excluded. Free fluid in the pelvis is likely reactive or physiologic.   Electronically Signed   By: Burman NievesWilliam  Stevens M.D.   On: 03/17/2014 05:10     EKG Interpretation None      MDM   Final diagnoses:  Ileitis    38 year old female with a history of IBS presents to the emergency department for right lower quadrant abdominal pain. Significant for leukocytosis of 15.6. CT ordered for further evaluation of symptoms which shows inflammation around the cecum and mid cecal wall thickening. There is also evidence of right colonic diverticulitis. Imaging unable to exclude neoplasm. No evidence of appendicitis.  Patient treated in ED with Cipro and Flagyl. Pain controlled with morphine. Patient will be admitted to the hospitalist service for further evaluation and management of symptoms.   Filed Vitals:   03/17/14 0028 03/17/14 0213 03/17/14 0352  BP: 122/77 135/82 123/61  Pulse: 90  78   Temp: 98 F (36.7 C)    TempSrc: Oral    Resp: 18 13 24   Height: 5\' 3"  (1.6 m)    Weight: 103 lb (46.72 kg)    SpO2: 100% 100% 100%     Antony Madura, PA-C 03/17/14 [phone]

## 2014-03-17 NOTE — Progress Notes (Signed)
Patient arrived to 4N06 from ED. Oriented to call bell and room. Pt alert and oriented x4. Complaining of pain in abdomen. Md notified of patients arrival.

## 2014-03-17 NOTE — ED Provider Notes (Signed)
Medical screening examination/treatment/procedure(s) were performed by non-physician practitioner and as supervising physician I was immediately available for consultation/collaboration.   EKG Interpretation None       Millissa Deese K Chauntelle Azpeitia-Rasch, MD 03/17/14 [phone]

## 2014-03-17 NOTE — H&P (Signed)
[facility] History and Physical  Laura Hudson ZOX:[id number] DOB: May 31, 1976 DOA: 03/17/2014  Referring physician: EDP PCP: Pcp Not In System   Chief Complaint: Abdominal pain   HPI: Laura Hudson is a 38 y.o. female with history of IBS who presents with complaints of worsening abdominal pain x1 day and nonbloody nausea/vomiting and diarrhea. She reports that for the past week she is just not been feeling well, and with her IBS she has a diarrheal bowel movement in the mornings; but yesterday she had severe abdomen pain right-sided -which she states she's never had with her IBS, and non bloody diarrhea x2. she denies fevers, dysuria melena and no hematochezia. She was seen in the ED and CT of the abdomen and pelvis showed Inflammatory process in the right lower quadrant with fluid around the cecum and mild cecal wall thickening, diverticula present in the area-change is likely due to right colonic diverticulitis. She was started on empiric antibiotics with Cipro and Flagyl and admitted for further evaluation and management.     Review of Systems The patient denies anorexia, fever, weight loss,, vision loss, decreased hearing, hoarseness, chest pain, syncope, dyspnea on exertion, peripheral edema, balance deficits, hemoptysis, melena, hematochezia, severe indigestion/heartburn, hematuria, incontinence, genital sores, muscle weakness, suspicious skin lesions, transient blindness, difficulty walking, depression, unusual weight change.  Past Medical History  Diagnosis Date  . IBS (irritable bowel syndrome)   . Anxiety   . Panic disorder   . Migraines    Past Surgical History  Procedure Laterality Date  . Abdominal hysterectomy    . Dilation and curettage of uterus    . Laparoscopy     Social History:  reports that she has been smoking.  She does not have any smokeless tobacco history on file. She reports that she drinks alcohol. She reports that she does not use illicit  drugs.  Allergies  Allergen Reactions  . Blueberry [Vaccinium Angustifolium] Itching and Rash  . Cephalexin Rash    History reviewed. No pertinent family history.   Prior to Admission medications   Medication Sig Start Date End Date Taking? Authorizing Provider  clonazePAM (KLONOPIN) 1 MG tablet Take 1 mg by mouth 2 (two) times daily as needed for anxiety.   Yes Historical Provider, MD  dicyclomine (BENTYL) 20 MG tablet Take 20 mg by mouth every 6 (six) hours as needed for spasms.   Yes Historical Provider, MD  diphenhydrAMINE (BENADRYL) 25 MG tablet Take 50 mg by mouth daily as needed for allergies.   Yes Historical Provider, MD  fexofenadine (ALLEGRA) 180 MG tablet Take 180 mg by mouth daily.   Yes Historical Provider, MD  Pseudoeph-Doxylamine-DM-APAP (NYQUIL PO) Take 2 tablets by mouth at bedtime as needed (cold/sleep).   Yes Historical Provider, MD   Physical Exam: Filed Vitals:   03/17/14 1013  BP: 93/62  Pulse: 69  Temp: 98.4 F (36.9 C)  Resp: 18    BP 93/62  Pulse 69  Temp(Src) 98.4 F (36.9 C) (Oral)  Resp 18  Ht 5\' 3"  (1.6 m)  Wt 46.72 kg (103 lb)  BMI 18.25 kg/m2  SpO2 100%  LMP 10/03/2011 Constitutional: Vital signs reviewed.  Patient is a well-developed thin-appearing young female,  in no acute distress and cooperative with exam. Alert and oriented x3.  Head: Normocephalic and atraumatic Mouth: no erythema or exudates, MMM Eyes: PERRL, EOMI, conjunctivae normal, No scleral icterus.  Neck: Supple, Trachea midline normal ROM, No JVD, mass, thyromegaly, or carotid bruit present.  Cardiovascular:  RRR, S1 normal, S2 normal, no MRG, pulses symmetric and intact bilaterally Pulmonary/Chest: normal respiratory effort, CTAB, no wheezes, rales, or rhonchi Abdominal: Soft. Lower abdominal tenderness,, greater in the right,  no rebound tenderness non-distended, bowel sounds are normal, no masses, organomegaly, or guarding present.  GU: no CVA tenderness  extremities: No  cyanosis and no edema   Neurological: A&O x3, Strength is normal and symmetric bilaterally, cranial nerve II-XII are grossly intact, no focal motor deficit, sensory intact to light touch bilaterally.  Skin: Warm, dry and intact. No rash, cyanosis, or clubbing.  Psychiatric: Normal mood and affect. speech and behavior is normal. Judgment and thought content normal. Cognition and memory are normal.                Labs on Admission:  Basic Metabolic Panel:  Recent Labs Lab 03/17/14 0033  NA 139  K 3.9  CL 102  CO2 22  GLUCOSE 86  BUN 8  CREATININE 0.67  CALCIUM 9.2   Liver Function Tests:  Recent Labs Lab 03/17/14 0033  AST 15  ALT 16  ALKPHOS 51  BILITOT 0.3  PROT 7.3  ALBUMIN 4.3   No results found for this basename: LIPASE, AMYLASE,  in the last 168 hours No results found for this basename: AMMONIA,  in the last 168 hours CBC:  Recent Labs Lab 03/17/14 0033  WBC 15.6*  NEUTROABS 10.9*  HGB 12.6  HCT 37.2  MCV 88.2  PLT 390   Cardiac Enzymes: No results found for this basename: CKTOTAL, CKMB, CKMBINDEX, TROPONINI,  in the last 168 hours  BNP (last 3 results) No results found for this basename: PROBNP,  in the last 8760 hours CBG: No results found for this basename: GLUCAP,  in the last 168 hours  Radiological Exams on Admission: Ct Abdomen Pelvis W Contrast  03/17/2014   CLINICAL DATA:  Right lower quadrant abdominal pain beginning this morning. Nausea and diarrhea.  EXAM: CT ABDOMEN AND PELVIS WITH CONTRAST  TECHNIQUE: Multidetector CT imaging of the abdomen and pelvis was performed using the standard protocol following bolus administration of intravenous contrast.  CONTRAST:  100mL OMNIPAQUE IOHEXOL 300 MG/ML  SOLN  COMPARISON:  03/20/2013  FINDINGS: Lung bases are clear.  Liver, spleen, gallbladder, pancreas, adrenal glands, kidneys, abdominal aorta, inferior vena cava, and retroperitoneal lymph nodes are unremarkable. Stomach, small bowel, and colon  are not abnormally distended. No free air or free fluid in the abdomen.  Pelvis: The appendix itself is normal but there is evidence of fluid in the right lower quadrant surrounding the cecum. The cecal wall appears to be focally thickened. This suggests an inflammatory process, possibly representing right colonic diverticulitis. Focal colitis or Crohn's disease could also have this appearance. Underlying tumor is not entirely excluded either. Small amount of free fluid in the pelvis is likely reactive or physiologic. Prominent vaginal gas. No findings to suggest diverticulitis. Involuting follicle demonstrated in the left ovary. Uterus appears to be surgically absent. Bladder wall is not thickened. No destructive bone lesions.  IMPRESSION: Inflammatory process in the right lower quadrant with fluid around the cecum and mild cecal wall thickening. The appendix itself is normal. Diverticula are present in this area in the changes most likely due to right colonic diverticulitis. However, other inflammatory processes or neoplasm are not entirely excluded. Free fluid in the pelvis is likely reactive or physiologic.   Electronically Signed   By: Burman NievesWilliam  Stevens M.D.   On: 03/17/2014 05:10  Assessment/Plan Active Problems:   right sided Colitis/? Diverticulitis, acute -As discussed above, will continue empiric antibiotics of Cipro and Flagyl -Obtain C. difficile studies if any further diarrhea -Supportive care-antiemetics and analgesics as appropriate -As consulted GI for further recommendations   Leukocytosis, unspecified -Likely secondary to above -see treatment as above follow and recheck   IBS (irritable bowel syndrome) -Continue outpatient medications   Anxiety -Continue outpatient medications        Code Status: full Family Communication: husband at bedside [facility]  Time spent:>6230mins  Kela MillinVIYUOH,Delray Reza C [facility] Pager [phone]

## 2014-03-17 NOTE — ED Notes (Signed)
PT placed in gown and in bed. Pt monitored by 5-lead, bp cuff, and pulse ox.

## 2014-03-17 NOTE — ED Notes (Signed)
Pt asked to provide urine sample. PT reports that she cannot at this time. Pt asked to inform nurse or tech when she has to use the bathroom.

## 2014-03-17 NOTE — ED Notes (Signed)
 bolus started to boost pt's BP before giving morphine.

## 2014-03-17 NOTE — ED Notes (Addendum)
Presents with right lower abdominal pain described as severe began this AM associated with nausea and diarrhea, pain has progressively worsened.   Last meal at 3 pm.

## 2014-03-17 NOTE — Consult Note (Signed)
Referring Provider: [facility] Care Physician:  Pcp Not In System Primary Gastroenterologist:  Dr.Stark  Reason for Consultation:  Acute abdominal pain, abnormal CT  HPI: Laura Hudson is a 38 y.o. female  Female  With hx of IBS, anxiety, hx of migraines. Pt had undergone Gi workup in 2011,for alternating bowel  Habits and diarrhea, abdominal pain. She had EGD and Colonoscopy both of which were normal. Random biopsies of the colon were negative and small bowel biopsies did not show any inflammation or villous atrophy. She has used Imodium daily recently which works well-if takes one the no further BM's for the remainder of the day. And and patient had onset of acute lower abdominal pain early yesterday morning. She said initially the pain was fairly diffuse and severe and radiated a 10 out of 10. She tried to go to work but had to go home because the pain. She says the pain persisted and progressed and eventually localized in her right lower quadrant when she came to the emergency room last night. She did have an episode of nausea and vomiting yesterday no fever chills or sweats. She had 1 or 2 loose bowel movements yesterday which were nonbloody. No dysuria urgency or frequency. Patient is status post-bilateral tubal ligation and had had a remote partial hysterectomy in 2006. Workup in the emergency room revealed a WBC of 15.6, hemoglobin 12.6 hematocrit of 37.2 electrolytes unremarkable LFTs normal. CT scan of the abdomen and pelvis shows an inflammatory process in the right lower quadrant with fluid around the cecum and mild cecal wall thickening the appendix is visualized and appears normal areas there are diverticuli in the right colon and in the area of the cecum and it was felt the changes are most consistent with an acute diverticulitis. There is a small amount of free fluid in the pelvis as well. Patient says her pain is not quite as bad this morning though still rates of an  8/10.    Past Medical History  Diagnosis Date  . IBS (irritable bowel syndrome)   . Anxiety   . Panic disorder   . Migraines     Past Surgical History  Procedure Laterality Date  . Abdominal hysterectomy    . Dilation and curettage of uterus    . Laparoscopy      Prior to Admission medications   Medication Sig Start Date End Date Taking? Authorizing Provider  clonazePAM (KLONOPIN) 1 MG tablet Take 1 mg by mouth 2 (two) times daily as needed for anxiety.   Yes Historical Provider, MD  dicyclomine (BENTYL) 20 MG tablet Take 20 mg by mouth every 6 (six) hours as needed for spasms.   Yes Historical Provider, MD  diphenhydrAMINE (BENADRYL) 25 MG tablet Take 50 mg by mouth daily as needed for allergies.   Yes Historical Provider, MD  fexofenadine (ALLEGRA) 180 MG tablet Take 180 mg by mouth daily.   Yes Historical Provider, MD  Pseudoeph-Doxylamine-DM-APAP (NYQUIL PO) Take 2 tablets by mouth at bedtime as needed (cold/sleep).   Yes Historical Provider, MD    Current Facility-Administered Medications  Medication Dose Route Frequency Provider Last Rate Last Dose  . 0.9 %  sodium chloride infusion   Intravenous Continuous Kela MillinAdeline C Viyuoh, MD 100 mL/hr at 03/17/14 1124    . acetaminophen (TYLENOL) tablet 650 mg  650 mg Oral Q6H PRN Kela MillinAdeline C Viyuoh, MD   650 mg at 03/17/14 1006  . ciprofloxacin (CIPRO) IVPB 400 mg  400 mg Intravenous Q12H  Kela Millin, MD      . clonazePAM (KLONOPIN) tablet 1 mg  1 mg Oral BID PRN Kela Millin, MD      . dicyclomine (BENTYL) tablet 20 mg  20 mg Oral Q6H PRN Adeline C Viyuoh, MD      . diphenhydrAMINE (BENADRYL) capsule 50 mg  50 mg Oral Daily PRN Adeline C Viyuoh, MD      . enoxaparin (LOVENOX) injection 40 mg  40 mg Subcutaneous Q24H Adeline C Viyuoh, MD      . loratadine (CLARITIN) tablet 10 mg  10 mg Oral Daily Adeline C Viyuoh, MD      . metroNIDAZOLE (FLAGYL) IVPB 500 mg  500 mg Intravenous Q8H Adeline C Viyuoh, MD      . morphine 2 MG/ML  injection 1-2 mg  1-2 mg Intravenous Q4H PRN Kela Millin, MD   2 mg at 03/17/14 0829  . ondansetron (ZOFRAN) injection 4 mg  4 mg Intravenous Q4H PRN Kela Millin, MD   4 mg at 03/17/14 0829    Allergies as of 03/17/2014 - Review Complete 03/17/2014  Allergen Reaction Noted  . Blueberry [vaccinium angustifolium] Itching and Rash 05/22/2012  . Cephalexin Rash     History reviewed. No pertinent family history.  History   Social History  . Marital Status: Married    Spouse Name: N/A    Number of Children: N/A  . Years of Education: N/A   Occupational History  . Not on file.   Social History Main Topics  . Smoking status: Current Some Day Smoker -- 0.50 packs/day  . Smokeless tobacco: Not on file  . Alcohol Use: Yes     Comment: Occassional Use  . Drug Use: No  . Sexual Activity: Yes   Other Topics Concern  . Not on file   Social History Narrative  . No narrative on file    Review of Systems: Pertinent positive and negative review of systems were noted in the above HPI section.  All other review of systems was otherwise negative.n.  Physical Exam: Vital signs in last 24 hours: Temp:  [98 F (36.7 C)-98.4 F (36.9 C)] 98.4 F (36.9 C) (07/08 1013) Pulse Rate:  [66-90] 69 (07/08 1013) Resp:  [13-24] 18 (07/08 1013) BP: (93-135)/(57-82) 93/62 mmHg (07/08 1013) SpO2:  [98 %-100 %] 100 % (07/08 1013) Weight:  [103 lb (46.72 kg)] 103 lb (46.72 kg) (07/08 0028)   General:   Alert,  Well-developed, well-nourished,AA female  pleasant and cooperative in NAD Head:  Normocephalic and atraumatic. Eyes:  Sclera clear, no icterus.   Conjunctiva pink. Ears:  Normal auditory acuity. Nose:  No deformity, discharge,  or lesions. Mouth:  No deformity or lesions.   Neck:  Supple; no masses or thyromegaly. Lungs:  Clear throughout to auscultation.   No wheezes, crackles, or rhonchi. Heart:  Regular rate and rhythm; no murmurs, clicks, rubs,  or gallops. Abdomen:   Soft,quite tender RLQ with some guarding, no rebound, no mass or HSM  Rectal:  Deferred  Msk:  Symmetrical without gross deformities. . Pulses:  Normal pulses noted. Extremities:  Without clubbing or edema. Neurologic:  Alert and  oriented x4;  grossly normal neurologically. Skin:  Intact without significant lesions or rashes.. Psych:  Alert and cooperative. Normal mood and affect.  Intake/Output from previous day:   Intake/Output this shift:    Lab Results:  Recent Labs  03/17/14 0033  WBC 15.6*  HGB 12.6  HCT 37.2  PLT 390   BMET  Recent Labs  03/17/14 0033  NA 139  K 3.9  CL 102  CO2 22  GLUCOSE 86  BUN 8  CREATININE 0.67  CALCIUM 9.2   LFT  Recent Labs  03/17/14 0033  PROT 7.3  ALBUMIN 4.3  AST 15  ALT 16  ALKPHOS 51  BILITOT 0.3   PT/INR No results found for this basename: LABPROT, INR,  in the last 72 hours Hepatitis Panel No results found for this basename: HEPBSAG, HCVAB, HEPAIGM, HEPBIGM,  in the last 72 hours   IMPRESSION:   #171  38 year old female with history of IBS and chronic diarrhea presenting with acute severe lower abdominal pain now localized to the right lower quadrant. CT scan confirms inflammatory process in the right lower quadrant which appears separate from the appendix and is most likely an acute right-sided diverticulitis with diverticuli visualized in that area. Other possibilities include other acute infectious colitis or underlying IBD. #2 anxiety with history of panic disorder  #3 history of migraines #4 status post hysterectomy   PLAN: #1  patient has been started on IV Cipro and IV Flagyl, would continue both #2 start clear liquids #3 analgesics as needed #4 no plans for any endoscopic intervention at this time, but will need outpatient followup. Will follow with you   Laura Hudson  03/17/2014, 11:36 AM     ________________________________________________________________________  [facility] MD note:  I  personally examined the patient, reviewed the data and agree with the assessment and plan described above.  Right sided diverticulitis is not very common but that indeed may be the case here.  Certainly an acute illness with no mor than her usual intermittent IBS issues lately.  Colonsocopy with look in TI 3-4 years ago was normal.  For now, continue IV Abx, offer clear liquids.  Follow clinically.  She asked that I change her pain meds from morphine to dilaudid due to nausea and I will do that.   Laura Buntinganiel Ancil Dewan, MD [facility] Pager [phone]

## 2014-03-17 NOTE — Progress Notes (Signed)
UR complete.  Shaterrica Territo RN, MSN 

## 2014-03-17 NOTE — ED Notes (Signed)
Attempted report 

## 2014-03-17 NOTE — ED Notes (Signed)
CT notified that the pt has finished her contrast.  

## 2014-03-18 LAB — BASIC METABOLIC PANEL
Anion gap: 13 (ref 5–15)
BUN: 3 mg/dL — AB (ref 6–23)
CO2: 21 mEq/L (ref 19–32)
CREATININE: 0.67 mg/dL (ref 0.50–1.10)
Calcium: 8.3 mg/dL — ABNORMAL LOW (ref 8.4–10.5)
Chloride: 102 mEq/L (ref 96–112)
Glucose, Bld: 82 mg/dL (ref 70–99)
POTASSIUM: 4 meq/L (ref 3.7–5.3)
Sodium: 136 mEq/L — ABNORMAL LOW (ref 137–147)

## 2014-03-18 LAB — CBC
HCT: 31.4 % — ABNORMAL LOW (ref 36.0–46.0)
Hemoglobin: 10.3 g/dL — ABNORMAL LOW (ref 12.0–15.0)
MCH: 29.5 pg (ref 26.0–34.0)
MCHC: 32.8 g/dL (ref 30.0–36.0)
MCV: 90 fL (ref 78.0–100.0)
Platelets: 285 10*3/uL (ref 150–400)
RBC: 3.49 MIL/uL — ABNORMAL LOW (ref 3.87–5.11)
RDW: 15 % (ref 11.5–15.5)
WBC: 9.6 10*3/uL (ref 4.0–10.5)

## 2014-03-18 MED ORDER — BOOST / RESOURCE BREEZE PO LIQD
1.0000 | Freq: Three times a day (TID) | ORAL | Status: DC
  Administered 2014-03-18 – 2014-03-19 (×3): 1 via ORAL

## 2014-03-18 MED ORDER — METRONIDAZOLE 500 MG PO TABS
500.0000 mg | ORAL_TABLET | Freq: Three times a day (TID) | ORAL | Status: DC
  Administered 2014-03-19: 500 mg via ORAL
  Filled 2014-03-18: qty 1

## 2014-03-18 MED ORDER — CIPROFLOXACIN HCL 500 MG PO TABS
500.0000 mg | ORAL_TABLET | Freq: Two times a day (BID) | ORAL | Status: DC
  Administered 2014-03-19: 500 mg via ORAL
  Filled 2014-03-18 (×2): qty 1

## 2014-03-18 NOTE — Progress Notes (Signed)
Daily Rounding Note  03/18/2014, 9:55 AM  LOS: 1 day   SUBJECTIVE:       No BM since yest AM.  Abdominal pain much improved.  No fever or chills.  Tolerating clears and appetite  improved  OBJECTIVE:         Vital signs in last 24 hours:    Temp:  [98.2 F (36.8 C)-98.4 F (36.9 C)] 98.2 F (36.8 C) (07/09 0700) Pulse Rate:  [62-71] 70 (07/09 0700) Resp:  [18-19] 18 (07/09 0700) BP: (91-99)/(55-66) 99/66 mmHg (07/09 0700) SpO2:  [99 %-100 %] 100 % (07/09 0700) Last BM Date: 03/16/14 General: looks well.   Comfortable and pleasant.  Heart: RRR Chest: clear bil.  No labored breathing Abdomen: soft, NT, ND.  BS active  Extremities: no CCE Neuro/Psych:  Pleasant, relaxed, fully alert and engaged  Intake/Output from previous day:    Intake/Output this shift:    Lab Results:  Recent Labs  03/17/14 0033 03/18/14 0538  WBC 15.6* 9.6  HGB 12.6 10.3*  HCT 37.2 31.4*  PLT 390 285   BMET  Recent Labs  03/17/14 0033 03/18/14 0538  NA 139 136*  K 3.9 4.0  CL 102 102  CO2 22 21  GLUCOSE 86 82  BUN 8 3*  CREATININE 0.67 0.67  CALCIUM 9.2 8.3*   LFT  Recent Labs  03/17/14 0033  PROT 7.3  ALBUMIN 4.3  AST 15  ALT 16  ALKPHOS 51  BILITOT 0.3   PT/INR No results found for this basename: LABPROT, INR,  in the last 72 hours Hepatitis Panel No results found for this basename: HEPBSAG, HCVAB, HEPAIGM, HEPBIGM,  in the last 72 hours  Studies/Results: Ct Abdomen Pelvis W Contrast 03/17/2014    COMPARISON:  03/20/2013  FINDINGS: Lung bases are clear.  Liver, spleen, gallbladder, pancreas, adrenal glands, kidneys, abdominal aorta, inferior vena cava, and retroperitoneal lymph nodes are unremarkable. Stomach, small bowel, and colon are not abnormally distended. No free air or free fluid in the abdomen.  Pelvis: The appendix itself is normal but there is evidence of fluid in the right lower quadrant surrounding  the cecum. The cecal wall appears to be focally thickened. This suggests an inflammatory process, possibly representing right colonic diverticulitis. Focal colitis or Crohn's disease could also have this appearance. Underlying tumor is not entirely excluded either. Small amount of free fluid in the pelvis is likely reactive or physiologic. Prominent vaginal gas. No findings to suggest diverticulitis. Involuting follicle demonstrated in the left ovary. Uterus appears to be surgically absent. Bladder wall is not thickened. No destructive bone lesions.  IMPRESSION: Inflammatory process in the right lower quadrant with fluid around the cecum and mild cecal wall thickening. The appendix itself is normal. Diverticula are present in this area in the changes most likely due to right colonic diverticulitis. However, other inflammatory processes or neoplasm are not entirely excluded. Free fluid in the pelvis is likely reactive or physiologic.   Electronically Signed   By: Burman Nieves M.D.   On: 03/17/2014 05:10   Scheduled Meds: . ciprofloxacin  400 mg Intravenous Q12H  . enoxaparin (LOVENOX) injection  40 mg Subcutaneous Q24H  . loratadine  10 mg Oral Daily  . metronidazole  500 mg Intravenous Q8H   Continuous Infusions: . sodium chloride 100 mL/hr at 03/18/14 0735   PRN Meds:.acetaminophen, clonazePAM, dicyclomine, diphenhydrAMINE, HYDROmorphone (DILAUDID) injection, ondansetron (ZOFRAN) IV, promethazine   ASSESMENT:   *  Right lower quadrant inflammatory process, normal appendix, ? Diverticulitis.  Day 2 IV Cipro/Flagyl.  Pain improved.  No  Stools in 24 hours.   *  IBS, diarrhea predominant.   *  Anxiety, panic disorder.  Not currently and issue.    PLAN   *  Soft diet.  Unable to find orders for lactose free diet which would be beneficial  * cancel C diff study, due to no stools. ? If we can change to po abx?  Will defer to MD.     Jennye MoccasinSarah Hudson  03/18/2014, 9:55 AM Pager:  [phone]  ________________________________________________________________________  [facility] MD note:  I personally examined the patient, reviewed the data and agree with the assessment and plan described above.  She is definitely feeling better overall.  Pain improving. WBC normalized.  Would switch to PO ABx, cipro/flagy, tomorrow AM and she should complete 7 day oral course.  She will need to follow up in office with Dr. Russella DarStark in 6-7 weeks to see how she is doing, consider repeat colonoscopy. She has had 20 pound weight loss over past several months, continued IBS like symptoms.  Our office will call her to set up that appt.   Laura Buntinganiel Namon Villarin, MD [facility] Pager [phone]

## 2014-03-18 NOTE — Progress Notes (Addendum)
[facility] PROGRESS NOTE  Laura Hudson:[id number] DOB: 02/04/1976 DOA: 03/17/2014 PCP: Boneta Lucks, NP  Assessment/Plan: right sided Colitis/? Diverticulitis, acute  -improved, continue empiric antibiotics of Cipro and Flagyl >> change to po in am -no further diarrhea, so no sample for c. diff -Supportive care-antiemetics and analgesics as appropriate  -appreciate GI input, diet advanced Leukocytosis, unspecified  -Likely secondary to above  -resolved IBS (irritable bowel syndrome)  -Continue outpatient medications  Anxiety  -Continue outpatient medications   Code Status: full Family Communication: family at bedside Disposition Plan:to home in am   Consultants:  GI  Procedures:  NONE  Antibiotics:  CIPRO and flagyl started 7/8  HPI/Subjective: States she feels better, decreased abd pain, no N/V. Denies diarrhea  Objective: Filed Vitals:   03/18/14 1433  BP: 105/71  Pulse: 70  Temp: 98.5 F (36.9 C)  Resp: 20    Intake/Output Summary (Last 24 hours) at 03/18/14 1845 Last data filed at 03/18/14 1000  Gross per 24 hour  Intake    240 ml  Output      0 ml  Net    240 ml   Filed Weights   03/17/14 0028  Weight: 46.72 kg (103 lb)    Exam:  General: alert & oriented x 3 In NAD Cardiovascular: RRR, nl S1 s2 Respiratory: CTAB Abdomen: soft +BS decreased tenderness/ND, no masses palpable Extremities: No cyanosis and no edema     Data Reviewed: Basic Metabolic Panel:  Recent Labs Lab 03/17/14 0033 03/18/14 0538  NA 139 136*  K 3.9 4.0  CL 102 102  CO2 22 21  GLUCOSE 86 82  BUN 8 3*  CREATININE 0.67 0.67  CALCIUM 9.2 8.3*   Liver Function Tests:  Recent Labs Lab 03/17/14 0033  AST 15  ALT 16  ALKPHOS 51  BILITOT 0.3  PROT 7.3  ALBUMIN 4.3   No results found for this basename: LIPASE, AMYLASE,  in the last 168 hours No results found for this basename: AMMONIA,  in the last 168 hours CBC:  Recent Labs Lab  03/17/14 0033 03/18/14 0538  WBC 15.6* 9.6  NEUTROABS 10.9*  --   HGB 12.6 10.3*  HCT 37.2 31.4*  MCV 88.2 90.0  PLT 390 285   Cardiac Enzymes: No results found for this basename: CKTOTAL, CKMB, CKMBINDEX, TROPONINI,  in the last 168 hours BNP (last 3 results) No results found for this basename: PROBNP,  in the last 8760 hours CBG: No results found for this basename: GLUCAP,  in the last 168 hours  No results found for this or any previous visit (from the past 240 hour(s)).   Studies: Ct Abdomen Pelvis W Contrast  03/17/2014   CLINICAL DATA:  Right lower quadrant abdominal pain beginning this morning. Nausea and diarrhea.  EXAM: CT ABDOMEN AND PELVIS WITH CONTRAST  TECHNIQUE: Multidetector CT imaging of the abdomen and pelvis was performed using the standard protocol following bolus administration of intravenous contrast.  CONTRAST:  OMNIPAQUE IOHEXOL 300 MG/ML  SOLN  COMPARISON:  03/20/2013  FINDINGS: Lung bases are clear.  Liver, spleen, gallbladder, pancreas, adrenal glands, kidneys, abdominal aorta, inferior vena cava, and retroperitoneal lymph nodes are unremarkable. Stomach, small bowel, and colon are not abnormally distended. No free air or free fluid in the abdomen.  Pelvis: The appendix itself is normal but there is evidence of fluid in the right lower quadrant surrounding the cecum. The cecal wall appears to be focally thickened. This suggests an inflammatory process,  possibly representing right colonic diverticulitis. Focal colitis or Crohn's disease could also have this appearance. Underlying tumor is not entirely excluded either. Small amount of free fluid in the pelvis is likely reactive or physiologic. Prominent vaginal gas. No findings to suggest diverticulitis. Involuting follicle demonstrated in the left ovary. Uterus appears to be surgically absent. Bladder wall is not thickened. No destructive bone lesions.  IMPRESSION: Inflammatory process in the right lower quadrant  with fluid around the cecum and mild cecal wall thickening. The appendix itself is normal. Diverticula are present in this area in the changes most likely due to right colonic diverticulitis. However, other inflammatory processes or neoplasm are not entirely excluded. Free fluid in the pelvis is likely reactive or physiologic.   Electronically Signed   By: Burman NievesWilliam  Stevens M.D.   On: 03/17/2014 05:10    Scheduled Meds: . ciprofloxacin  400 mg Intravenous Q12H  . enoxaparin (LOVENOX) injection  40 mg Subcutaneous Q24H  . feeding supplement (RESOURCE BREEZE)  1 Container Oral TID BM  . loratadine  10 mg Oral Daily  . metronidazole  500 mg Intravenous Q8H   Continuous Infusions: . sodium chloride 50 mL/hr at 03/18/14 1213    Active Problems:   IBS (irritable bowel syndrome)   Anxiety   Ileitis   Colitis, acute   Leukocytosis, unspecified   Colitis    Time spent: 25    [facility]YU[facility],Kemp Gomes C  [facility] Pager [phone]. If 7PM-7AM, please contact night-coverage at [email or web address], password [email or web address] 03/18/2014, 6:45 PM  LOS: 1 day

## 2014-03-18 NOTE — Progress Notes (Addendum)
INITIAL NUTRITION ASSESSMENT  DOCUMENTATION CODES Per approved criteria  -Underweight   INTERVENTION:  Resource Breeze PO TID, each supplement provides 250 kcal and 9 grams of protein  NUTRITION DIAGNOSIS: Underweight related to inadequate oral intake as evidenced by 18% weight loss in the past year.   Goal: Intake to meet >90% of estimated nutrition needs.  Monitor:  PO intake, labs, weight trend.  Reason for Assessment: MST  38 y.o. female  Admitting Dx: abdominal pain  ASSESSMENT: 38 y.o. female with history of IBS who presents with complaints of worsening abdominal pain x1 day and nonbloody nausea/vomiting and diarrhea. Admitted with right sided Colitis, ? diverticulitis, acute. Started on empiric antibiotics.  Nutrition focused physical exam completed.  No muscle or subcutaneous fat depletion noticed.  Patient c/o poor appetite. 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 Discussed ways to increase protein and calorie intake. Handouts provided. Encouraged small, frequent meals and snacks. 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 She is ordering meals and able to avoid foods that cause her GI distress (milk). Agreed to try [employer] Breeze supplement to increase PO intake.  Height: Ht Readings from Last 1 Encounters:  03/17/14 5\' 3"  (1.6 m)    Weight: Wt Readings from Last 1 Encounters:  03/17/14 103 lb (46.72 kg)    Ideal Body Weight: 52.3 kg  % Ideal Body Weight: 89%  Wt Readings from Last 10 Encounters:  03/17/14 103 lb (46.72 kg)  03/20/13 126 lb (57.153 kg)  05/22/12 129 lb 6.4 oz (58.695 kg)  11/03/09 112 lb (50.803 kg)  09/12/09 109 lb (49.442 kg)  12/30/08 116 lb (52.617 kg)    Usual Body Weight: 126 lb 1 year ago  % Usual Body Weight: 82%  BMI:  Body mass index is 18.25 kg/(m^2). Underweight  Estimated Nutritional Needs: Kcal:  1400-1600 Protein: 75-90 gm Fluid: 1.5-1.7 L  Skin: WDL  Diet Order: Parke SimmersBland  EDUCATION NEEDS: -Education needs addressed   Intake/Output Summary (Last 24 hours) at 03/18/14 1242 Last data filed at 03/18/14 1000  Gross per 24 hour  Intake    240 ml  Output      0 ml  Net    240 ml    Last BM: 7/7   Labs:   Recent Labs Lab 03/17/14 0033 03/18/14 0538  NA 139 136*  K 3.9 4.0  CL 102 102  CO2 22 21  BUN 8 3*  CREATININE 0.67 0.67  CALCIUM 9.2 8.3*  GLUCOSE 86 82    CBG (last 3)  No results found for this basename: GLUCAP,  in the last 72 hours  Scheduled Meds: . ciprofloxacin  400 mg Intravenous Q12H  . enoxaparin (LOVENOX) injection  40 mg Subcutaneous Q24H  . loratadine  10 mg Oral Daily  . metronidazole  500 mg Intravenous Q8H    Continuous Infusions: . sodium chloride 50 mL/hr at 03/18/14 1213    Past Medical History  Diagnosis Date  . IBS (irritable bowel syndrome)   . Anxiety   . Panic disorder   . Asthma   . Chronic bronchitis     "couple times q yr" (03/17/2014)  . Anemia   . Migraines     "worse in the last 2 months; now at least 2 times/wk" (03/17/2014)    Past Surgical History  Procedure Laterality Date  . Laparoscopic abdominal exploration  ~ 1989; ~  2004  . Vaginal hysterectomy  2006  . Dilation and curettage of uterus  1989  . Tubal ligation  2005    Joaquin Courts, RD, LDN, CNSC Pager [phone] After Hours Pager [phone]

## 2014-03-19 DIAGNOSIS — F411 Generalized anxiety disorder: Secondary | ICD-10-CM

## 2014-03-19 MED ORDER — METRONIDAZOLE 500 MG PO TABS: 500.0000 mg | ORAL_TABLET | Freq: Three times a day (TID) | ORAL | Status: DC

## 2014-03-19 MED ORDER — BOOST / RESOURCE BREEZE PO LIQD: 1.0000 | Freq: Three times a day (TID) | ORAL | Status: DC

## 2014-03-19 MED ORDER — HYDROCODONE-ACETAMINOPHEN 5-300 MG PO TABS
5.0000 mg | ORAL_TABLET | Freq: Four times a day (QID) | ORAL | Status: DC | PRN
Start: 2014-03-19 — End: 2016-05-10

## 2014-03-19 MED ORDER — CIPROFLOXACIN HCL 500 MG PO TABS: 500.0000 mg | ORAL_TABLET | Freq: Two times a day (BID) | ORAL | Status: DC

## 2014-03-19 NOTE — Progress Notes (Signed)
Discharge orders received.  Discharge orders and follow-up instructions reviewed with the patient.  VSS upon discharge.  Education complete.  Patient transported out via wheelchair, husband present. Sondra ComeSilva, Latia Mataya M, RN 03/19/2014

## 2014-03-19 NOTE — Discharge Summary (Signed)
Physician Discharge Summary  [name]:[id number] DOB: August 09, 1976 DOA: 03/17/2014  PCP: Boneta Lucks, NP  Admit date: 03/17/2014 Discharge date: 03/19/2014  Time spent: >30 minutes  Recommendations for Outpatient Follow-up:  Follow-up Information   Follow up with Boneta Lucks, NP. (in 1-2weeks, call for appt upon discharge)    Specialty:  Nurse Practitioner   Contact information:   [phone] [place] [address] [phone]       Follow up with Judie Petit T. Russella Dar, MD. (in 6-7weeks, call for appt upon discharge)    Specialty:  Gastroenterology   Contact information:   [address] [place] [address] [phone]        Discharge Diagnoses:  Active Problems:   IBS (irritable bowel syndrome)   Anxiety   Ileitis   Colitis, acute   Leukocytosis, unspecified   Colitis   Discharge Condition: Improved/stable  Diet recommendation: Soft lactose-free  Filed Weights   03/17/14 0028  Weight: 46.72 kg (103 lb)    History of present illness:  Laura Hudson is a 38 y.o. female with history of IBS who presents with complaints of worsening abdominal pain x1 day and nonbloody nausea/vomiting and diarrhea. She reports that for the past week she is just not been feeling well, and with her IBS she has a diarrheal bowel movement in the mornings; but yesterday she had severe abdomen pain right-sided -which she states she's never had with her IBS, and non bloody diarrhea x2. she denies fevers, dysuria melena and no hematochezia. She was seen in the ED and CT of the abdomen and pelvis showed Inflammatory process in the right lower quadrant with fluid around the cecum and mild cecal wall thickening, diverticula present in the area-change is likely due to right colonic diverticulitis. She was started on empiric antibiotics with Cipro and Flagyl and admitted for further evaluation and management.   Hospital Course:  Right sided Colitis/ Diverticulitis, acute  -As  discussed above, upon admission patient was started on empiric antibiotics of Cipro and Flagyl and GI was consulted. - C. difficile studies if any further diarrhea, but she had no further diarrhea and some samples sent  -She was managed with Supportive care-antiemetics and analgesics as appropriate  -She improved clinically and her diet was advanced and she is continued to tolerate it. -Her antibiotics were changed to by mouth and she recommends to complete 7 days of antibiotics. She is to followup with Dr. Russella Dar in 6-7 weeks.  Leukocytosis, unspecified  -Likely secondary to above  -Resolved on antibiotics as above IBS (irritable bowel syndrome)  -Continue outpatient medications  Anxiety  -Continue outpatient medications   Procedures:  none  Consultations:  GI  Discharge Exam: Filed Vitals:   03/19/14 1439  BP: 110/64  Pulse: 75  Temp: 99.4 F (37.4 C)  Resp: 20    Exam:  General: alert & oriented x 3 In NAD  Cardiovascular: RRR, nl S1 s2  Respiratory: CTAB  Abdomen: soft +BS decreased tenderness/ND, no masses palpable  Extremities: No cyanosis and no edema    Discharge Instructions You were cared for by a hospitalist during your hospital stay. If you have any questions about your discharge medications or the care you received while you were in the hospital after you are discharged, you can call the unit and asked to speak with the hospitalist on call if the hospitalist that took care of you is not available. Once you are discharged, your primary care physician will handle any  further medical issues. Please note that NO REFILLS for any discharge medications will be authorized once you are discharged, as it is imperative that you return to your primary care physician (or establish a relationship with a primary care physician if you do not have one) for your aftercare needs so that they can reassess your need for medications and monitor your lab values.  Discharge  Instructions   Diet general    Complete by:  As directed      Discharge instructions    Complete by:  As directed   Soft lactose-free diet.     Increase activity slowly    Complete by:  As directed             Medication List         ciprofloxacin 500 MG tablet  Commonly known as:  CIPRO  Take 1 tablet (500 mg total) by mouth 2 (two) times daily.     clonazePAM 1 MG tablet  Commonly known as:  KLONOPIN  Take 1 mg by mouth 2 (two) times daily as needed for anxiety.     dicyclomine 20 MG tablet  Commonly known as:  BENTYL  Take 20 mg by mouth every 6 (six) hours as needed for spasms.     diphenhydrAMINE 25 MG tablet  Commonly known as:  BENADRYL  Take 50 mg by mouth daily as needed for allergies.     feeding supplement (RESOURCE BREEZE) Liqd  Take 1 Container by mouth 3 (three) times daily between meals.     fexofenadine 180 MG tablet  Commonly known as:  ALLEGRA  Take 180 mg by mouth daily.     Hydrocodone-Acetaminophen 5-300 MG Tabs  Commonly known as:  VICODIN  Take 5 mg by mouth every 6 (six) hours as needed.     metroNIDAZOLE 500 MG tablet  Commonly known as:  FLAGYL  Take 1 tablet (500 mg total) by mouth every 8 (eight) hours.     NYQUIL PO  Take 2 tablets by mouth at bedtime as needed (cold/sleep).       Allergies  Allergen Reactions  . Blueberry [Vaccinium Angustifolium] Itching and Rash  . Cephalexin Rash      The results of significant diagnostics from this hospitalization (including imaging, microbiology, ancillary and laboratory) are listed below for reference.    Significant Diagnostic Studies: Ct Abdomen Pelvis W Contrast  03/17/2014   CLINICAL DATA:  Right lower quadrant abdominal pain beginning this morning. Nausea and diarrhea.  EXAM: CT ABDOMEN AND PELVIS WITH CONTRAST  TECHNIQUE: Multidetector CT imaging of the abdomen and pelvis was performed using the standard protocol following bolus administration of intravenous contrast.  CONTRAST:   OMNIPAQUE IOHEXOL 300 MG/ML  SOLN  COMPARISON:  03/20/2013  FINDINGS: Lung bases are clear.  Liver, spleen, gallbladder, pancreas, adrenal glands, kidneys, abdominal aorta, inferior vena cava, and retroperitoneal lymph nodes are unremarkable. Stomach, small bowel, and colon are not abnormally distended. No free air or free fluid in the abdomen.  Pelvis: The appendix itself is normal but there is evidence of fluid in the right lower quadrant surrounding the cecum. The cecal wall appears to be focally thickened. This suggests an inflammatory process, possibly representing right colonic diverticulitis. Focal colitis or Crohn's disease could also have this appearance. Underlying tumor is not entirely excluded either. Small amount of free fluid in the pelvis is likely reactive or physiologic. Prominent vaginal gas. No findings to suggest diverticulitis. Involuting follicle demonstrated in the left ovary.  Uterus appears to be surgically absent. Bladder wall is not thickened. No destructive bone lesions.  IMPRESSION: Inflammatory process in the right lower quadrant with fluid around the cecum and mild cecal wall thickening. The appendix itself is normal. Diverticula are present in this area in the changes most likely due to right colonic diverticulitis. However, other inflammatory processes or neoplasm are not entirely excluded. Free fluid in the pelvis is likely reactive or physiologic.   Electronically Signed   By: Burman NievesWilliam  Stevens M.D.   On: 03/17/2014 05:10    Microbiology: No results found for this or any previous visit (from the past 240 hour(s)).   Labs: Basic Metabolic Panel:  Recent Labs Lab 03/17/14 0033 03/18/14 0538  NA 139 136*  K 3.9 4.0  CL 102 102  CO2 22 21  GLUCOSE 86 82  BUN 8 3*  CREATININE 0.67 0.67  CALCIUM 9.2 8.3*   Liver Function Tests:  Recent Labs Lab 03/17/14 0033  AST 15  ALT 16  ALKPHOS 51  BILITOT 0.3  PROT 7.3  ALBUMIN 4.3   No results found for this  basename: LIPASE, AMYLASE,  in the last 168 hours No results found for this basename: AMMONIA,  in the last 168 hours CBC:  Recent Labs Lab 03/17/14 0033 03/18/14 0538  WBC 15.6* 9.6  NEUTROABS 10.9*  --   HGB 12.6 10.3*  HCT 37.2 31.4*  MCV 88.2 90.0  PLT 390 285   Cardiac Enzymes: No results found for this basename: CKTOTAL, CKMB, CKMBINDEX, TROPONINI,  in the last 168 hours BNP: BNP (last 3 results) No results found for this basename: PROBNP,  in the last 8760 hours CBG: No results found for this basename: GLUCAP,  in the last 168 hours     Signed:  Christeen Lai C  [facility] 03/19/2014, 2:58 PM

## 2014-04-26 ENCOUNTER — Encounter: Payer: Self-pay | Admitting: Gastroenterology

## 2014-05-03 ENCOUNTER — Ambulatory Visit: Payer: 59 | Admitting: Gastroenterology

## 2016-05-10 ENCOUNTER — Encounter (HOSPITAL_COMMUNITY): Payer: Self-pay | Admitting: *Deleted

## 2016-05-10 ENCOUNTER — Emergency Department (HOSPITAL_COMMUNITY)
Admission: EM | Admit: 2016-05-10 | Discharge: 2016-05-10 | Disposition: A | Discharge status: Home / Self Care | Payer: Self-pay | Attending: Emergency Medicine | Admitting: Emergency Medicine

## 2016-05-10 DIAGNOSIS — R21 Rash and other nonspecific skin eruption: Secondary | ICD-10-CM | POA: Insufficient documentation

## 2016-05-10 DIAGNOSIS — Y92000 Kitchen of unspecified non-institutional (private) residence as  the place of occurrence of the external cause: Secondary | ICD-10-CM | POA: Insufficient documentation

## 2016-05-10 DIAGNOSIS — F17210 Nicotine dependence, cigarettes, uncomplicated: Secondary | ICD-10-CM | POA: Insufficient documentation

## 2016-05-10 DIAGNOSIS — Y9389 Activity, other specified: Secondary | ICD-10-CM | POA: Insufficient documentation

## 2016-05-10 DIAGNOSIS — J45909 Unspecified asthma, uncomplicated: Secondary | ICD-10-CM | POA: Insufficient documentation

## 2016-05-10 DIAGNOSIS — Y990 Civilian activity done for income or pay: Secondary | ICD-10-CM | POA: Insufficient documentation

## 2016-05-10 DIAGNOSIS — X501XXA Overexertion from prolonged static or awkward postures, initial encounter: Secondary | ICD-10-CM | POA: Insufficient documentation

## 2016-05-10 DIAGNOSIS — M5431 Sciatica, right side: Secondary | ICD-10-CM | POA: Insufficient documentation

## 2016-05-10 LAB — CBC
HEMATOCRIT: 39.3 % (ref 36.0–46.0)
Hemoglobin: 12.7 g/dL (ref 12.0–15.0)
MCH: 28.7 pg (ref 26.0–34.0)
MCHC: 32.3 g/dL (ref 30.0–36.0)
MCV: 88.7 fL (ref 78.0–100.0)
Platelets: 449 10*3/uL — ABNORMAL HIGH (ref 150–400)
RBC: 4.43 MIL/uL (ref 3.87–5.11)
RDW: 15.2 % (ref 11.5–15.5)
WBC: 10.7 10*3/uL — AB (ref 4.0–10.5)

## 2016-05-10 LAB — BASIC METABOLIC PANEL
Anion gap: 6 (ref 5–15)
BUN: 6 mg/dL (ref 6–20)
CO2: 24 mmol/L (ref 22–32)
Calcium: 9.2 mg/dL (ref 8.9–10.3)
Chloride: 107 mmol/L (ref 101–111)
Creatinine, Ser: 0.83 mg/dL (ref 0.44–1.00)
GFR calc Af Amer: >60 mL/min (ref >60)
GFR calc non Af Amer: >60 mL/min (ref >60)
GLUCOSE: 108 mg/dL — AB (ref 65–99)
POTASSIUM: 3.9 mmol/L (ref 3.5–5.1)
SODIUM: 137 mmol/L (ref 135–145)

## 2016-05-10 MED ORDER — PERMETHRIN 5 % EX CREA
TOPICAL_CREAM | Freq: Once | CUTANEOUS | Status: AC
  Administered 2016-05-10: 06:00:00 via TOPICAL
  Filled 2016-05-10: qty 60

## 2016-05-10 MED ORDER — KETOROLAC TROMETHAMINE 60 MG/2ML IM SOLN
60.0000 mg | Freq: Once | INTRAMUSCULAR | Status: AC
  Administered 2016-05-10: 60 mg via INTRAMUSCULAR
  Filled 2016-05-10: qty 2

## 2016-05-10 MED ORDER — PREDNISONE 10 MG PO TABS: 20.0000 mg | ORAL_TABLET | Freq: Every day | ORAL | 0 refills | Status: DC

## 2016-05-10 MED ORDER — CYCLOBENZAPRINE HCL 10 MG PO TABS: 5.0000 mg | ORAL_TABLET | Freq: Two times a day (BID) | ORAL | 0 refills | Status: DC | PRN

## 2016-05-10 MED ORDER — TRAMADOL HCL 50 MG PO TABS: 50.0000 mg | ORAL_TABLET | Freq: Four times a day (QID) | ORAL | 0 refills | Status: DC | PRN

## 2016-05-10 NOTE — ED Provider Notes (Signed)
[facility]-EMERGENCY DEPT Provider Note   CSN: [id number] Arrival date & time: 05/10/16  0213     History   Chief Complaint Chief Complaint  Patient presents with  . Leg Pain  . Insect Bite    HPI Laura Hudson is a 40 y.o. female.  HPI  Patient has PMH of anemia, anxiety, sciatica, bronchitis, diverticulitis, IBS, migraines and panic disorder.   She presents the emergency department for evaluation of rash to her bilateral legs as well as right buttock pain. 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 She has pain from the buttock that shots down her leg. No numbness, tingling, weakness. She says she has had this before and it feels like sciatica.  Her place of employment is currently infested with bed buds. She has multiple small rashes to her bilateral lower extremities which she describes as pruritic.  Per RN note she endorses generalized weakness and chills but the patient does not mention this to me, when asked she denies and said that she has felt in general tired but not really weak.  No f/n/v/d, no focal or generalized weakness. No confusion, myalgias, swelling, headache, cough, cp. She denies being out in the woods or any place where it was likely there would be bugs/ticks/spiders  Past Medical History:  Diagnosis Date  . Anemia   . Anxiety   . Asthma   . Chronic bronchitis (HCC)    "couple times q yr" (03/17/2014)  . Diverticulitis   . IBS (irritable bowel syndrome)   . Migraines    "worse in the last 2 months; now at least 2 times/wk" (03/17/2014)  . Panic disorder     Patient Active Problem List   Diagnosis Date Noted  . Ileitis 03/17/2014  . Colitis, acute 03/17/2014  . Leukocytosis, unspecified 03/17/2014  . Colitis 03/17/2014  . IBS (irritable bowel syndrome)   . Anxiety   . Panic disorder   . Migraines   . IRRITABLE BOWEL SYNDROME 11/03/2009  . WEIGHT LOSS 09/12/2009  .  DIARRHEA 09/12/2009  . ABDOMINAL PAIN OTHER SPECIFIED SITE 09/12/2009  . ANEMIA-NOS 12/30/2008  . ABDOMINAL PAIN, GENERALIZED 12/30/2008  . BLOOD IN STOOL, OCCULT 12/30/2008    Past Surgical History:  Procedure Laterality Date  . DILATION AND CURETTAGE OF UTERUS  1989  . LAPAROSCOPIC ABDOMINAL EXPLORATION  ~ 1989; ~ 2004  . TUBAL LIGATION  2005  . VAGINAL HYSTERECTOMY  2006    OB History    Gravida Para Term Preterm AB Living   5 4     1 4    SAB TAB Ectopic Multiple Live Births   1               Home Medications    Prior to Admission medications   Medication Sig Start Date End Date Taking? Authorizing Provider  clonazePAM (KLONOPIN) 1 MG tablet Take 1 mg by mouth 2 (two) times daily as needed for anxiety.   Yes Historical Provider, MD  dicyclomine (BENTYL) 20 MG tablet Take 20 mg by mouth every 6 (six) hours as needed for spasms.   Yes Historical Provider, MD  diphenhydrAMINE (BENADRYL) 25 MG tablet Take 50 mg by mouth daily as needed for allergies.   Yes Historical Provider, MD  Pseudoeph-Doxylamine-DM-APAP (NYQUIL PO) Take 2 tablets by mouth at bedtime as needed (cold/sleep).   Yes Historical Provider, MD  cyclobenzaprine (FLEXERIL) 10 MG tablet  Take 0.5-1 tablets (5-10 mg total) by mouth 2 (two) times daily as needed. 05/10/16   Aliyyah Riese Neva Seat, PA-C  predniSONE (DELTASONE) 10 MG tablet Take 2 tablets (20 mg total) by mouth daily. Prednisone dose pack directions:   5 tabs on day one, 4 tabs on day two, 3 tabs on day three, 2 tabs on day four, 1 tab on day five. Disp # [address] PA-C 05/10/16   Marlon Pel, PA-C  traMADol (ULTRAM) 50 MG tablet Take 1 tablet (50 mg total) by mouth every 6 (six) hours as needed. 05/10/16   Marlon Pel, PA-C    Family History No family history on file.  Social History Social History  Substance Use Topics  . Smoking status: Current Some Day Smoker    Packs/day: 0.50    Years: 13.00    Types: Cigarettes  . Smokeless  tobacco: Never Used  . Alcohol use Yes     Comment: 03/17/2014 "might have a drink on the holiday"     Allergies   Blueberry [vaccinium angustifolium] and Cephalexin   Review of Systems Review of Systems Review of Systems All other systems negative except as documented in the HPI. All pertinent positives and negatives as reviewed in the HPI.   Physical Exam Updated Vital Signs BP 110/76   Pulse 76   Temp 98.5 F (36.9 C) (Oral)   Resp 16   LMP 10/03/2011   SpO2 99%   Physical Exam  Constitutional: She appears well-developed and well-nourished.  HENT:  Head: Normocephalic and atraumatic.  Eyes: Conjunctivae are normal. Pupils are equal, round, and reactive to light.  Neck: Trachea normal, normal range of motion and full passive range of motion without pain. Neck supple.  Cardiovascular: Normal rate, regular rhythm and normal pulses.   Pulmonary/Chest: Effort normal and breath sounds normal. Chest wall is not dull to percussion. She exhibits no tenderness, no crepitus, no edema, no deformity and no retraction.  Abdominal: Soft. Normal appearance and bowel sounds are normal.  Musculoskeletal: Normal range of motion.  Symmetrical and physiologic strength to bilateral lower extremities.  Neurosensory function adequate to both legs Skin color is normal. Skin is warm and moist.  No step off deformity appreciated and no midline bony tenderness.  Ambulatory  No crepitus, laceration, effusion, induration, lesions Pedal pulses are symmetrical and palpable bilaterally  No thoracic or lumbar tenderness. + tenderness to the sciatic notch. No clonus on dorsiflextion   Neurological: She is alert. She has normal strength.  Skin: Skin is warm, dry and intact.  Psychiatric: She has a normal mood and affect. Her speech is normal and behavior is normal. Judgment and thought content normal. Cognition and memory are normal.   ED Treatments / Results  Labs (all labs ordered are listed, but  only abnormal results are displayed) Labs Reviewed  BASIC METABOLIC PANEL - Abnormal; Notable for the following:       Result Value   Glucose, Bld 108 (*)    All other components within normal limits  CBC - Abnormal; Notable for the following:    WBC 10.7 (*)    Platelets 449 (*)    All other components within normal limits    EKG  EKG Interpretation None      Radiology No results found.  Procedures Procedures (including critical care time)  Medications Ordered in ED Medications  ketorolac (TORADOL) injection 60 mg (not administered)  permethrin (ELIMITE) 5 % cream (not administered)    Initial Impression /  Assessment and Plan / ED Course  I have reviewed the triage vital signs and the nursing notes.  Pertinent labs & imaging results that were available during my care of the patient were reviewed by me and considered in my medical decision making (see chart for details).  Clinical Course    40 y.o.Floraine R Vanwagoner's  with back pain.   No neurological deficits and normal neuro exam. No loss of bowel or bladder control. No concern for cauda equina at this time base on HPI and physical exam findings. No fever, night sweats, weight loss, h/o cancer, IVDU. The patient can walk with some discomfort.   Patient Plan 1. Medications: NSAIDs and/or muscle relaxer. Cont usual home medications unless otherwise directed. --- given Permetharin cream in ED to treat possible bed bugs, otherwise I expect that the prednisone will help resolve rash otherwise. 2. Treatment: rest, drink plenty of fluids, gentle stretching as discussed, alternate ice and heat  3. Follow Up: Please followup with your primary doctor for discussion of your diagnoses and further evaluation after today's visit; if you do not have a primary care doctor use the resource guide provided to find one  Advised to follow-up with the orthopedist if symptoms do not start to resolve in the next 2-3 days. If develop loss of  bowel or urinary control return to the ED as soon as possible for further evaluation. To take the medications as prescribed as they can cause harm if not taken appropriately.   Vital signs are stable at discharge. Vitals:   05/10/16 0445 05/10/16 0515  BP: 100/61 110/76  Pulse: 77 76  Resp: 16 16  Temp:      Patient/guardian has voiced understanding and agreed to follow-up with the PCP or specialist.   Final Clinical Impressions(s) / ED Diagnoses   Final diagnoses:  Sciatica of right side  Rash    New Prescriptions New Prescriptions   CYCLOBENZAPRINE (FLEXERIL) 10 MG TABLET    Take 0.5-1 tablets (5-10 mg total) by mouth 2 (two) times daily as needed.   PREDNISONE (DELTASONE) 10 MG TABLET    Take 2 tablets (20 mg total) by mouth daily. Prednisone dose pack directions:   5 tabs on day one, 4 tabs on day two, 3 tabs on day three, 2 tabs on day four, 1 tab on day five. Disp # 15  Anshika Pethtel, PA-C   TRAMADOL (ULTRAM) 50 MG TABLET    Take 1 tablet (50 mg total) by mouth every 6 (six) hours as needed.     Marlon Peliffany Fredna Stricker, PA-C 05/10/16 0602    Gilda Creasehristopher J Pollina, MD 05/10/16 [phone]

## 2016-05-10 NOTE — ED Notes (Signed)
Pt. advised nurse first that she will go out and will be back , pt. is not present when her room is available .

## 2016-05-10 NOTE — ED Triage Notes (Signed)
Pt c/o R hip pain radiating into leg. Also woke up on Tuesday with dime-size whelps to lower leg, reports generalized weakness and chills since then

## 2016-12-12 ENCOUNTER — Ambulatory Visit (HOSPITAL_COMMUNITY)
Admission: EM | Admit: 2016-12-12 | Discharge: 2016-12-12 | Disposition: A | Discharge status: Home / Self Care | Payer: Self-pay | Attending: Family Medicine | Admitting: Family Medicine

## 2016-12-12 ENCOUNTER — Encounter (HOSPITAL_COMMUNITY): Payer: Self-pay | Admitting: Family Medicine

## 2016-12-12 DIAGNOSIS — M5432 Sciatica, left side: Secondary | ICD-10-CM

## 2016-12-12 DIAGNOSIS — M5431 Sciatica, right side: Secondary | ICD-10-CM

## 2016-12-12 MED ORDER — CYCLOBENZAPRINE HCL 5 MG PO TABS: ORAL_TABLET | ORAL | 1 refills | Status: DC

## 2016-12-12 MED ORDER — PREDNISONE 10 MG PO TABS: ORAL_TABLET | ORAL | 0 refills | Status: DC

## 2016-12-12 MED ORDER — OMEPRAZOLE 20 MG PO CPDR: 20.0000 mg | DELAYED_RELEASE_CAPSULE | Freq: Every day | ORAL | 0 refills | Status: DC

## 2016-12-12 NOTE — ED Triage Notes (Signed)
Pt here for right lower back pain radiating into leg and numbness in toes. sts x 2 weeks. Denies injury.

## 2016-12-12 NOTE — ED Provider Notes (Signed)
CSN: [id number]     Arrival date & time 12/12/16  1020 History   First MD Initiated Contact with Patient 12/12/16 1057     Chief Complaint  Patient presents with  . Back Pain   (Consider location/radiation/quality/duration/timing/severity/associated sxs/prior Treatment) Patient c/o low back pain radiating down leg for 2 days   The history is provided by the patient.  Back Pain  Location:  Generalized Quality:  Aching Radiates to:  Does not radiate Pain severity:  Mild Pain is:  Worse during the day Onset quality:  Sudden Duration:  2 days Timing:  Constant Progression:  Worsening Chronicity:  New Relieved by:  Nothing Worsened by:  Nothing Ineffective treatments:  None tried   Past Medical History:  Diagnosis Date  . Anemia   . Anxiety   . Asthma   . Chronic bronchitis (HCC)    "couple times q yr" (03/17/2014)  . Diverticulitis   . IBS (irritable bowel syndrome)   . Migraines    "worse in the last 2 months; now at least 2 times/wk" (03/17/2014)  . Panic disorder    Past Surgical History:  Procedure Laterality Date  . DILATION AND CURETTAGE OF UTERUS  1989  . LAPAROSCOPIC ABDOMINAL EXPLORATION  ~ 1989; ~ 2004  . TUBAL LIGATION  2005  . VAGINAL HYSTERECTOMY  2006   History reviewed. No pertinent family history. Social History  Substance Use Topics  . Smoking status: Current Some Day Smoker    Packs/day: 0.50    Years: 13.00    Types: Cigarettes  . Smokeless tobacco: Never Used  . Alcohol use Yes     Comment: 03/17/2014 "might have a drink on the holiday"   OB History    Gravida Para Term Preterm AB Living   SAB TAB Ectopic Multiple Live Births   1             Review of Systems  Constitutional: Negative.   HENT: Negative.   Eyes: Negative.   Respiratory: Negative.   Cardiovascular: Negative.   Gastrointestinal: Negative.   Endocrine: Negative.   Genitourinary: Negative.   Musculoskeletal: Positive for back pain.  Allergic/Immunologic:  Negative.   Neurological: Negative.   Hematological: Negative.   Psychiatric/Behavioral: Negative.     Allergies  Blueberry [vaccinium angustifolium] and Cephalexin  Home Medications   Prior to Admission medications   Medication Sig Start Date End Date Taking? Authorizing Provider  cyclobenzaprine (FLEXERIL) 5 MG tablet Take 1-2 po tid prn back pain 12/12/16   Deatra Canter, FNP  omeprazole (PRILOSEC) 20 MG capsule Take 1 capsule (20 mg total) by mouth daily. 12/12/16   Deatra Canter, FNP  predniSONE (DELTASONE) 10 MG tablet Take 6 po qd x 2d then 4po qd x2d then 3 po qd x 2d then 2po qd x 2d then 1po qd x2days then stop 12/12/16   Deatra Canter, FNP   Meds Ordered and Administered this Visit  Medications - No data to display  BP 117/66   Pulse 77   Temp 98.6 F (37 C)   Resp 18   LMP 10/03/2011   SpO2 100%  No data found.   Physical Exam  Constitutional: She appears well-developed and well-nourished.  HENT:  Head: Normocephalic and atraumatic.  Eyes: Conjunctivae and EOM are normal. Pupils are equal, round, and reactive to light.  Neck: Normal range of motion. Neck supple.  Cardiovascular: Normal rate, regular rhythm and normal heart  sounds.   Pulmonary/Chest: Effort normal and breath sounds normal.  Abdominal: Soft. Bowel sounds are normal.  Musculoskeletal: She exhibits tenderness.  TTP right LS spine and right sciatic notch  Nursing note and vitals reviewed.   Urgent Care Course     Procedures (including critical care time)  Labs Review Labs Reviewed - No data to display  Imaging Review No results found.   Visual Acuity Review  Right Eye Distance:   Left Eye Distance:   Bilateral Distance:    Right Eye Near:   Left Eye Near:    Bilateral Near:         MDM  Right Sciatica Cyclobenzaprine  1-2 po tid prn #30 Prednisone taper Prilosec    Follow up prn      Deatra Canter, FNP 12/12/16 1156

## 2017-03-24 ENCOUNTER — Emergency Department (HOSPITAL_COMMUNITY)
Admission: EM | Admit: 2017-03-24 | Discharge: 2017-03-24 | Disposition: A | Discharge status: Home / Self Care | Payer: Self-pay | Attending: Emergency Medicine | Admitting: Emergency Medicine

## 2017-03-24 ENCOUNTER — Encounter (HOSPITAL_COMMUNITY): Payer: Self-pay | Admitting: *Deleted

## 2017-03-24 ENCOUNTER — Emergency Department (HOSPITAL_COMMUNITY): Payer: Self-pay

## 2017-03-24 DIAGNOSIS — J45909 Unspecified asthma, uncomplicated: Secondary | ICD-10-CM | POA: Insufficient documentation

## 2017-03-24 DIAGNOSIS — D729 Disorder of white blood cells, unspecified: Secondary | ICD-10-CM | POA: Insufficient documentation

## 2017-03-24 DIAGNOSIS — K5792 Diverticulitis of intestine, part unspecified, without perforation or abscess without bleeding: Secondary | ICD-10-CM | POA: Insufficient documentation

## 2017-03-24 DIAGNOSIS — N83202 Unspecified ovarian cyst, left side: Secondary | ICD-10-CM | POA: Insufficient documentation

## 2017-03-24 DIAGNOSIS — Z79899 Other long term (current) drug therapy: Secondary | ICD-10-CM | POA: Insufficient documentation

## 2017-03-24 DIAGNOSIS — F17210 Nicotine dependence, cigarettes, uncomplicated: Secondary | ICD-10-CM | POA: Insufficient documentation

## 2017-03-24 DIAGNOSIS — D649 Anemia, unspecified: Secondary | ICD-10-CM | POA: Insufficient documentation

## 2017-03-24 DIAGNOSIS — R197 Diarrhea, unspecified: Secondary | ICD-10-CM

## 2017-03-24 DIAGNOSIS — R112 Nausea with vomiting, unspecified: Secondary | ICD-10-CM | POA: Insufficient documentation

## 2017-03-24 DIAGNOSIS — R1032 Left lower quadrant pain: Secondary | ICD-10-CM | POA: Insufficient documentation

## 2017-03-24 LAB — CBC WITH DIFFERENTIAL/PLATELET
Basophils Absolute: 0 10*3/uL (ref 0.0–0.1)
Basophils Relative: 0 %
EOS ABS: 0.1 10*3/uL (ref 0.0–0.7)
EOS PCT: 1 %
HCT: 38 % (ref 36.0–46.0)
Hemoglobin: 13.2 g/dL (ref 12.0–15.0)
LYMPHS ABS: 3 10*3/uL (ref 0.7–4.0)
LYMPHS PCT: 25 %
MCH: 30.5 pg (ref 26.0–34.0)
MCHC: 34.7 g/dL (ref 30.0–36.0)
MCV: 87.8 fL (ref 78.0–100.0)
Monocytes Absolute: 0.9 10*3/uL (ref 0.1–1.0)
Monocytes Relative: 7 %
Neutro Abs: 8.1 10*3/uL — ABNORMAL HIGH (ref 1.7–7.7)
Neutrophils Relative %: 67 %
Platelets: 380 10*3/uL (ref 150–400)
RBC: 4.33 MIL/uL (ref 3.87–5.11)
RDW: 14.6 % (ref 11.5–15.5)
WBC: 12.1 10*3/uL — ABNORMAL HIGH (ref 4.0–10.5)

## 2017-03-24 LAB — COMPREHENSIVE METABOLIC PANEL
ALT: 12 U/L — ABNORMAL LOW (ref 14–54)
ANION GAP: 8 (ref 5–15)
AST: 16 U/L (ref 15–41)
Albumin: 4 g/dL (ref 3.5–5.0)
Alkaline Phosphatase: 48 U/L (ref 38–126)
BUN: <5 mg/dL — ABNORMAL LOW (ref 6–20)
CO2: 25 mmol/L (ref 22–32)
Calcium: 9.3 mg/dL (ref 8.9–10.3)
Chloride: 107 mmol/L (ref 101–111)
Creatinine, Ser: 0.69 mg/dL (ref 0.44–1.00)
GFR calc Af Amer: >60 mL/min (ref >60)
Glucose, Bld: 69 mg/dL (ref 65–99)
Potassium: 4.4 mmol/L (ref 3.5–5.1)
SODIUM: 140 mmol/L (ref 135–145)
TOTAL PROTEIN: 7 g/dL (ref 6.5–8.1)
Total Bilirubin: 0.4 mg/dL (ref 0.3–1.2)

## 2017-03-24 LAB — URINALYSIS, ROUTINE W REFLEX MICROSCOPIC
BILIRUBIN URINE: NEGATIVE
Glucose, UA: NEGATIVE mg/dL
HGB URINE DIPSTICK: NEGATIVE
KETONES UR: NEGATIVE mg/dL
Leukocytes, UA: NEGATIVE
NITRITE: NEGATIVE
PROTEIN: NEGATIVE mg/dL
pH: 7 (ref 5.0–8.0)

## 2017-03-24 LAB — LIPASE, BLOOD: Lipase: 26 U/L (ref 11–51)

## 2017-03-24 MED ORDER — FLUCONAZOLE 150 MG PO TABS: 150.0000 mg | ORAL_TABLET | Freq: Once | ORAL | 0 refills | Status: AC

## 2017-03-24 MED ORDER — MORPHINE SULFATE (PF) 4 MG/ML IV SOLN
4.0000 mg | Freq: Once | INTRAVENOUS | Status: AC
  Administered 2017-03-24: 4 mg via INTRAVENOUS
  Filled 2017-03-24: qty 1

## 2017-03-24 MED ORDER — NAPROXEN 500 MG PO TABS: 500.0000 mg | ORAL_TABLET | Freq: Two times a day (BID) | ORAL | 0 refills | Status: DC | PRN

## 2017-03-24 MED ORDER — PROMETHAZINE HCL 25 MG/ML IJ SOLN
25.0000 mg | Freq: Once | INTRAMUSCULAR | Status: AC
  Administered 2017-03-24: 25 mg via INTRAVENOUS
  Filled 2017-03-24: qty 1

## 2017-03-24 MED ORDER — HYDROCODONE-ACETAMINOPHEN 5-325 MG PO TABS: 1.0000 | ORAL_TABLET | Freq: Four times a day (QID) | ORAL | 0 refills | Status: DC | PRN

## 2017-03-24 MED ORDER — CIPROFLOXACIN HCL 500 MG PO TABS: 500.0000 mg | ORAL_TABLET | Freq: Two times a day (BID) | ORAL | 0 refills | Status: DC

## 2017-03-24 MED ORDER — IOPAMIDOL (ISOVUE-300) INJECTION 61%
INTRAVENOUS | Status: AC
  Administered 2017-03-24: 100 mL
  Filled 2017-03-24: qty 100

## 2017-03-24 MED ORDER — PROMETHAZINE HCL 25 MG PO TABS: 25.0000 mg | ORAL_TABLET | Freq: Four times a day (QID) | ORAL | 0 refills | Status: DC | PRN

## 2017-03-24 MED ORDER — METRONIDAZOLE 500 MG PO TABS: 500.0000 mg | ORAL_TABLET | Freq: Three times a day (TID) | ORAL | 0 refills | Status: AC

## 2017-03-24 MED ORDER — SODIUM CHLORIDE 0.9 % IV BOLUS (SEPSIS)
1000.0000 mL | Freq: Once | INTRAVENOUS | Status: AC
  Administered 2017-03-24: 1000 mL via INTRAVENOUS

## 2017-03-24 MED ORDER — OXYCODONE-ACETAMINOPHEN 5-325 MG PO TABS
1.0000 | ORAL_TABLET | Freq: Once | ORAL | Status: AC
  Administered 2017-03-24: 1 via ORAL
  Filled 2017-03-24: qty 1

## 2017-03-24 NOTE — ED Notes (Signed)
Pt states IV is painful. Redness/swelling noted at site. Area is hard and tender to touch. Dr. Madilyn Hookees notified and asked to assess pt arm.

## 2017-03-24 NOTE — Discharge Instructions (Signed)
Use phenergan as prescribed, as needed for nausea. Alternate between naprosyn and norco as directed as needed for pain, but don't drive while taking norco. Your CT scan did not show evidence of diverticulitis, but given your symptoms, we will treat you for presumptive early diverticulitis anyway; take antibiotics as directed until completed. If you develop a yeast infection, take the diflucan AFTER you're done with the cipro/flagyl antibiotics. Your CT scan did also show a small ovarian cyst, which could be contributing to the pain you had; use heating pad or warm soaks to help with pain. Stay well hydrated with small sips of fluids throughout the day. Follow a BRAT (banana-rice-applesauce-toast) diet as described below for the next 24-48 hours. The 'BRAT' diet is suggested, then progress to diet as tolerated as symptoms abate. Call your regular doctor if bloody stools, persistent diarrhea, vomiting, fever or abdominal pain. Follow up with your gastroenterologist in 1 week for recheck of symptoms. Return to ER for changing or worsening of symptoms.

## 2017-03-24 NOTE — ED Provider Notes (Signed)
[facility]ENCY DEPT Provider Note   CSN: [id number] Arrival date & time: 03/24/17  7829     History   Chief Complaint Chief Complaint  Patient presents with  . Abdominal Pain    HPI Laura Hudson is a 41 y.o. female with a PMHx of diverticulitis, IBS, asthma, anemia, and migraines, with a PSHx of tubal ligation and vaginal hysterectomy, who presents to the ED with complaints of gradual onset LLQ pain that began 4 days ago and was initially intermittent but yesterday became more constant and more severe. She describes the pain is 9/10 constant aching and throbbing LLQ pain that radiates to the LUQ as well as around her hips, worse with movement, mildly improved with heat, and unrelieved with ibuprofen, Tylenol, and BC powders. Associated symptoms includes chills, nausea, 2 episodes of nonbloody nonbilious emesis today, and 2 episodes of nonbloody looser than normal diarrhea this morning however prior to today she had not had a BM. Endorses NSAID use. GI dr is Dr. Claudette Head of [facility]; she hasn't seen him in a while; last colonoscopy was in 2015, which was reportedly normal. No PCP provider at this time.   She denies fevers, CP, SOB, constipation, obstipation, melena, hematochezia, hematemesis, hematuria, dysuria, vaginal bleeding/discharge, myalgias, arthralgias, numbness, tingling, focal weakness, or any other complaints at this time. Denies recent travel, sick contacts, suspicious food intake, EtOH use, or recent abx.    The history is provided by the patient and medical records. No language interpreter was used.  Abdominal Pain   This is a new problem. The current episode started more than 2 days ago. The problem occurs constantly. The problem has been gradually worsening. The pain is associated with an unknown factor. The pain is located in the LLQ. The quality of the pain is aching and throbbing. The pain is at a severity of 9/10. The pain is moderate. Associated symptoms include  diarrhea, nausea and vomiting. Pertinent negatives include fever, flatus, hematochezia, melena, constipation, dysuria, hematuria, arthralgias and myalgias. The symptoms are aggravated by activity. Relieved by: heat. Her past medical history is significant for irritable bowel syndrome. Past medical history comments: and diverticulitis.    Past Medical History:  Diagnosis Date  . Anemia   . Anxiety   . Asthma   . Chronic bronchitis (HCC)    "couple times q yr" (03/17/2014)  . Diverticulitis   . IBS (irritable bowel syndrome)   . Migraines    "worse in the last 2 months; now at least 2 times/wk" (03/17/2014)  . Panic disorder     Patient Active Problem List   Diagnosis Date Noted  . Ileitis 03/17/2014  . Colitis, acute 03/17/2014  . Leukocytosis, unspecified 03/17/2014  . Colitis 03/17/2014  . IBS (irritable bowel syndrome)   . Anxiety   . Panic disorder   . Migraines   . IRRITABLE BOWEL SYNDROME 11/03/2009  . WEIGHT LOSS 09/12/2009  . DIARRHEA 09/12/2009  . ABDOMINAL PAIN OTHER SPECIFIED SITE 09/12/2009  . ANEMIA-NOS 12/30/2008  . ABDOMINAL PAIN, GENERALIZED 12/30/2008  . BLOOD IN STOOL, OCCULT 12/30/2008    Past Surgical History:  Procedure Laterality Date  . DILATION AND CURETTAGE OF UTERUS  1989  . LAPAROSCOPIC ABDOMINAL EXPLORATION  ~ 1989; ~ 2004  . TUBAL LIGATION  2005  . VAGINAL HYSTERECTOMY  2006    OB History    Gravida Para Term Preterm AB Living   5 4     1 4    SAB TAB Ectopic Multiple Live  Births   1               Home Medications    Prior to Admission medications   Medication Sig Start Date End Date Taking? Authorizing Provider  cyclobenzaprine (FLEXERIL) 5 MG tablet Take 1-2 po tid prn back pain 12/12/16   Deatra Canterxford, William J, FNP  omeprazole (PRILOSEC) 20 MG capsule Take 1 capsule (20 mg total) by mouth daily. 12/12/16   Deatra Canterxford, William J, FNP  predniSONE (DELTASONE) 10 MG tablet Take 6 po qd x 2d then 4po qd x2d then 3 po qd x 2d then 2po qd x 2d then  1po qd x2days then stop 12/12/16   Deatra Canterxford, William J, FNP    Family History No family history on file.  Social History Social History  Substance Use Topics  . Smoking status: Current Some Day Smoker    Packs/day: 0.50    Years: 13.00    Types: Cigarettes  . Smokeless tobacco: Never Used  . Alcohol use Yes     Comment: 03/17/2014 "might have a drink on the holiday"     Allergies   Blueberry [vaccinium angustifolium] and Cephalexin   Review of Systems Review of Systems  Constitutional: Positive for chills. Negative for fever.  Respiratory: Negative for shortness of breath.   Cardiovascular: Negative for chest pain.  Gastrointestinal: Positive for abdominal pain, diarrhea, nausea and vomiting. Negative for blood in stool, constipation, flatus, hematochezia and melena.  Genitourinary: Negative for dysuria, hematuria, vaginal bleeding and vaginal discharge.  Musculoskeletal: Negative for arthralgias and myalgias.  Skin: Negative for color change.  Allergic/Immunologic: Negative for immunocompromised state.  Neurological: Negative for weakness and numbness.  Psychiatric/Behavioral: Negative for confusion.   All other systems reviewed and are negative for acute change except as noted in the HPI.    Physical Exam Updated Vital Signs BP 117/70 (BP Location: Left Arm)   Pulse 93   Temp 98 F (36.7 C) (Oral)   Resp 18   Ht 5\' 3"  (1.6 m)   Wt 52.6 kg (116 lb)   LMP 10/03/2011   SpO2 100%   BMI 20.55 kg/m   Physical Exam  Constitutional: She is oriented to person, place, and time. Vital signs are normal. She appears well-developed and well-nourished.  Non-toxic appearance. No distress.  Afebrile, nontoxic, NAD  HENT:  Head: Normocephalic and atraumatic.  Mouth/Throat: Oropharynx is clear and moist and mucous membranes are normal.  Eyes: Conjunctivae and EOM are normal. Right eye exhibits no discharge. Left eye exhibits no discharge.  Neck: Normal range of motion. Neck  supple.  Cardiovascular: Normal rate, regular rhythm, normal heart sounds and intact distal pulses.  Exam reveals no gallop and no friction rub.   No murmur heard. Pulmonary/Chest: Effort normal and breath sounds normal. No respiratory distress. She has no decreased breath sounds. She has no wheezes. She has no rhonchi. She has no rales.  Abdominal: Soft. Normal appearance and bowel sounds are normal. She exhibits no distension. There is tenderness in the left upper quadrant and left lower quadrant. There is no rigidity, no rebound, no guarding, no CVA tenderness, no tenderness at McBurney's point and negative Murphy's sign.  Soft, nondistended, +BS throughout, with moderate LLQ TTP and milder LUQ TTP, no r/g/r, neg murphy's, neg mcburney's, no CVA TTP   Genitourinary:  Genitourinary Comments: Pt declined  Musculoskeletal: Normal range of motion.  Neurological: She is alert and oriented to person, place, and time. She has normal strength. No sensory deficit.  Skin: Skin is warm, dry and intact. No rash noted.  Psychiatric: She has a normal mood and affect.  Nursing note and vitals reviewed.    ED Treatments / Results  Labs (all labs ordered are listed, but only abnormal results are displayed) Labs Reviewed  CBC WITH DIFFERENTIAL/PLATELET - Abnormal; Notable for the following:       Result Value   WBC 12.1 (*)    Neutro Abs 8.1 (*)    All other components within normal limits  COMPREHENSIVE METABOLIC PANEL - Abnormal; Notable for the following:    BUN <5 (*)    ALT 12 (*)    All other components within normal limits  URINALYSIS, ROUTINE W REFLEX MICROSCOPIC - Abnormal; Notable for the following:    Color, Urine STRAW (*)    Specific Gravity, Urine >1.046 (*)    All other components within normal limits  LIPASE, BLOOD    EKG  EKG Interpretation None       Radiology Ct Abdomen Pelvis W Contrast  Result Date: 03/24/2017 CLINICAL DATA:  Left lower quadrant abdominal pain with  nausea and vomiting and diarrhea for 4 days EXAM: CT ABDOMEN AND PELVIS WITH CONTRAST TECHNIQUE: Multidetector CT imaging of the abdomen and pelvis was performed using the standard protocol following bolus administration of intravenous contrast. CONTRAST:  ISOVUE-300 IOPAMIDOL (ISOVUE-300) INJECTION 61% COMPARISON:  03/17/2014 FINDINGS: Lower chest: No acute abnormality. Hepatobiliary: No focal liver abnormality is seen. No gallstones, gallbladder wall thickening, or biliary dilatation. Pancreas: Unremarkable. No pancreatic ductal dilatation or surrounding inflammatory changes. Spleen: Normal in size without focal abnormality. Adrenals/Urinary Tract: Adrenal glands are unremarkable. Kidneys are normal, without renal calculi, focal lesion, or hydronephrosis. Bladder is unremarkable. Stomach/Bowel: Stomach is within normal limits. Appendix appears normal. No evidence of bowel wall thickening, distention, or inflammatory changes. Vascular/Lymphatic: No significant vascular findings are present. No enlarged abdominal or pelvic lymph nodes. Reproductive: Remote hysterectomy. Suspect small collapsing cyst or follicle with peripheral enhancement of the left ovary, approximately 2 x 1 cm, image 61. Trace physiologic free fluid dependently in the pelvis. No fluid collection or abscess. Other: No abdominal wall hernia or abnormality. No abdominopelvic ascites. Musculoskeletal: Advanced L5-S1 degenerative disc disease with vacuum disc phenomena. No acute osseous finding. IMPRESSION: No acute intra-abdominal or pelvic finding. Remote hysterectomy No acute inflammatory process, fluid collection or abscess Electronically Signed   By: Judie Petit.  Shick M.D.   On: 03/24/2017 13:07    Procedures Procedures (including critical care time)  Medications Ordered in ED Medications  promethazine (PHENERGAN) injection 25 mg (25 mg Intravenous Given 03/24/17 1051)  sodium chloride 0.9 % bolus 1,000 mL (0 mLs Intravenous Stopped 03/24/17  1123)  morphine 4 MG/ML injection 4 mg (4 mg Intravenous Given 03/24/17 1051)  oxyCODONE-acetaminophen (PERCOCET/ROXICET) 5-325 MG per tablet 1 tablet (1 tablet Oral Given 03/24/17 1122)  iopamidol (ISOVUE-300) 61 % injection (100 mLs  Contrast Given 03/24/17 1242)  promethazine (PHENERGAN) injection 25 mg (25 mg Intravenous Given 03/24/17 1453)  morphine 4 MG/ML injection 4 mg (4 mg Intravenous Given 03/24/17 1453)     Initial Impression / Assessment and Plan / ED Course  I have reviewed the triage vital signs and the nursing notes.  Pertinent labs & imaging results that were available during my care of the patient were reviewed by me and considered in my medical decision making (see chart for details).     41 y.o. female here with LLQ pain 4 days that worsened last night, with associated  chills, n/v/d; states it feels like prior diverticulitis. On exam, moderate LLQ TTP and milder LUQ TTP, nonperitoneal, no flank tenderness. Will get labs and CT abd/pelv to eval for diverticulitis vs colitis vs other etiology. Will give pain meds, nausea meds, and fluids, then reassess shortly  12:53 PM CBC w/diff with mildly elevated WBC 12.1 with slightly neutrophilic predominance. CMP WNL. Lipase WNL. U/A not yet done, pt in CT now. Nursing staff informed me earlier that her IV had infiltrated with phenergan/morphine/fluids administration, they called pharmacy to see if we needed to do anything and they stated just supportive care with ice/elevation but no other interventions needed. PO percocet given and pt tolerated well; stated she felt better at that time. Will reassess once she comes back from CT.  2:32 PM U/A still not yet done, pt will provide sample now. States pain starting to return, and still slightly nauseated since she never received full dose of phenergan earlier; has good IV in place now, will repeat morphine/phenergan now. CT abd/pelv showing small collapsing L ovarian cyst, but otherwise no  acute findings; no mention of diverticula/diverticulitis however clinically diverticulitis still matches; will likely empirically treat for early diverticulitis. Offered pt option of performing pelvic exam to further evaluate her pain, however she declined this and states she thinks it's diverticulitis and doesn't desire further pelvic evaluation. Will await U/A and reassess shortly.   4:15 PM U/A unremarkable. Pt feeling much better and tolerating PO well. Will d/c home with pain meds, phenergan, and abx to empirically treat diverticulitis. Pt requested diflucan for yeast infection for after abx use, will write for this as well. Advised heating pad to abdomen to help with pain, particularly since ovarian cyst could still be contributory. Discussed BRAT diet, staying hydrated, and f/up with GI in 1wk for recheck. [place] prior to dispensing controlled substance medications, and 1 year search was notable for: Tramadol 50mg  #15 tabs on 05/10/16 but no other controlled substances found. Risks/benefits/alternatives and expectations discussed regarding controlled substances. Side effects of medications discussed. Informed consent obtained. I explained the diagnosis and have given explicit precautions to return to the ER including for any other new or worsening symptoms. The patient understands and accepts the medical plan as it's been dictated and I have answered their questions. Discharge instructions concerning home care and prescriptions have been given. The patient is STABLE and is discharged to home in good condition.    Final Clinical Impressions(s) / ED Diagnoses   Final diagnoses:  LLQ abdominal pain  Nausea vomiting and diarrhea  Neutrophilic leukocytosis  Left ovarian cyst  Diverticulitis    New Prescriptions New Prescriptions   CIPROFLOXACIN (CIPRO) 500 MG TABLET    Take 1 tablet (500 mg total) by mouth 2 (two) times daily. One po bid x 7 days   FLUCONAZOLE (DIFLUCAN) 150 MG  TABLET    Take 1 tablet (150 mg total) by mouth once. Take for yeast infection symptoms AFTER you're finished with the cipro/flagyl antibiotics   HYDROCODONE-ACETAMINOPHEN (NORCO) 5-325 MG TABLET    Take 1 tablet by mouth every 6 (six) hours as needed for severe pain.   METRONIDAZOLE (FLAGYL) 500 MG TABLET    Take 1 tablet (500 mg total) by mouth 3 (three) times daily.   NAPROXEN (NAPROSYN) 500 MG TABLET    Take 1 tablet (500 mg total) by mouth 2 (two) times daily as needed for mild pain, moderate pain or headache (TAKE WITH MEALS.).   PROMETHAZINE (PHENERGAN) 25 MG TABLET  Take 1 tablet (25 mg total) by mouth every 6 (six) hours as needed for nausea or vomiting.     [address] [place], [place] 03/24/17 1616    Tilden Fossa, MD 04/03/17 1242

## 2017-03-24 NOTE — ED Notes (Addendum)
This RN spoke with pharmacy Morrie Sheldon([facility]) in regards to the phenergan infiltration.  Supportive measures such as elevation of area and cold compresses are the recommendations. Will continue to monitor

## 2017-03-24 NOTE — ED Triage Notes (Signed)
Pt states LLQ pain x 4 days.  Feels similar to when she was dx with diverticulitis.

## 2018-09-18 IMAGING — CT CT ABD-PELV W/ CM
2 of 5 series · 17 of 46 positions shown, 19 images · IV contrast (Omni 300)
Comparison: 03/17/2014

CLINICAL DATA: Left lower quadrant abdominal pain with nausea and
vomiting and diarrhea for 4 days

EXAM:
CT ABDOMEN AND PELVIS WITH CONTRAST
TECHNIQUE: Multidetector CT imaging of the abdomen and pelvis was performed
using the standard protocol following bolus administration of
intravenous contrast.
CONTRAST:  100mL 1RUP0G-HZZ IOPAMIDOL (1RUP0G-HZZ) INJECTION 61%

[Series 3: a/p w/ 5mm · axial · 0.69mm/px · z∈[+843,+1208]mm · 14 of 83 slices shown, 16 images]
[im 5/83  soft-tissue]
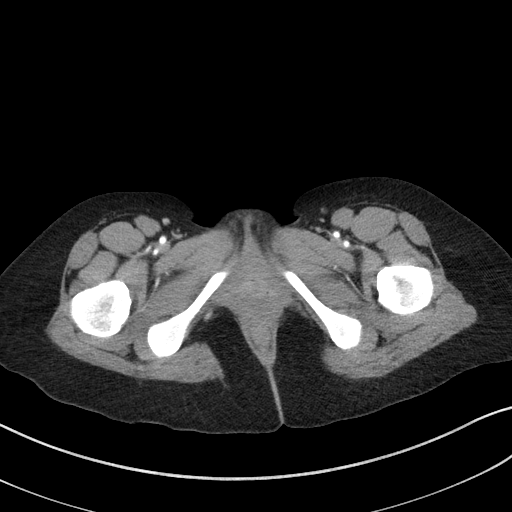
[im 5/83  bone]
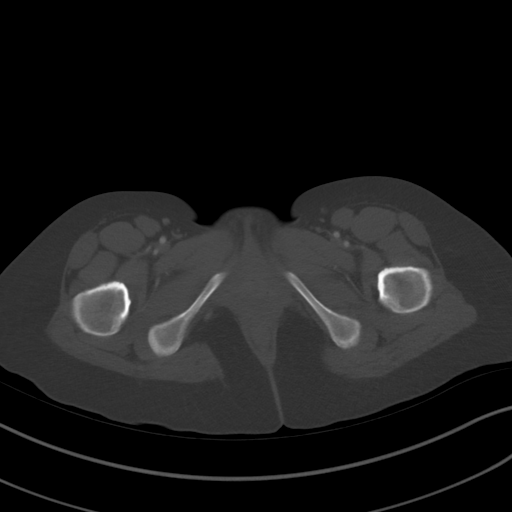
[im 13/83  soft-tissue]
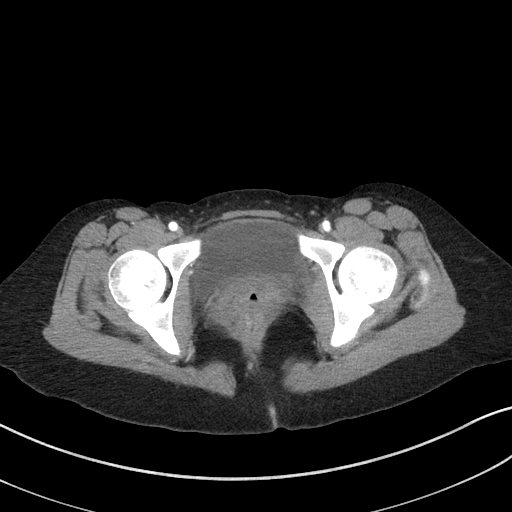
[im 17/83  soft-tissue]
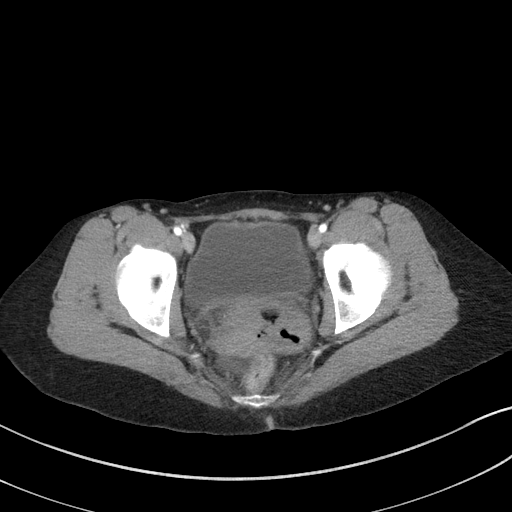
[im 21/83  soft-tissue]
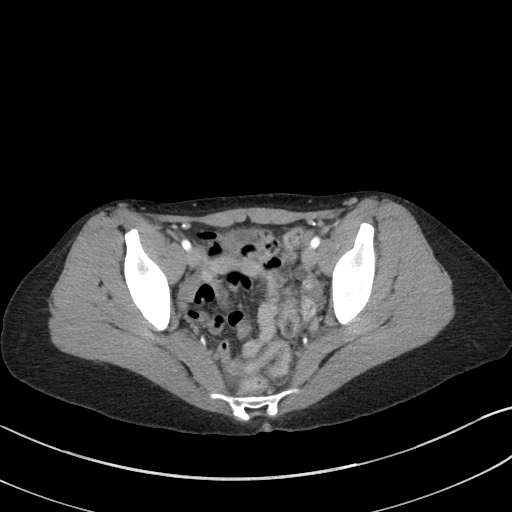
[im 29/83  soft-tissue]
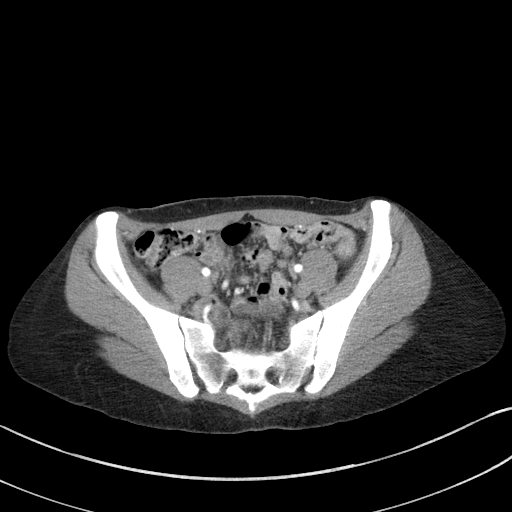
[im 33/83  soft-tissue]
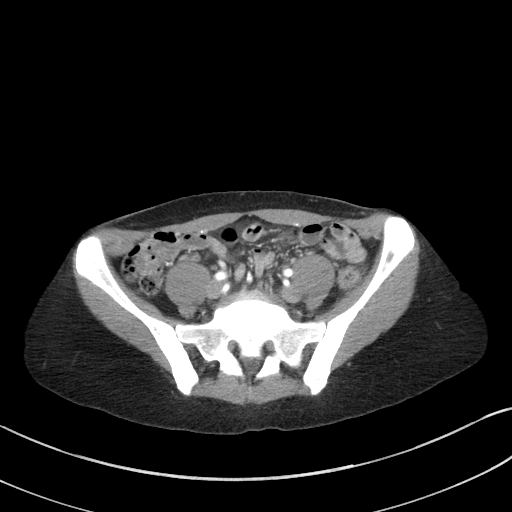
[im 37/83  soft-tissue]
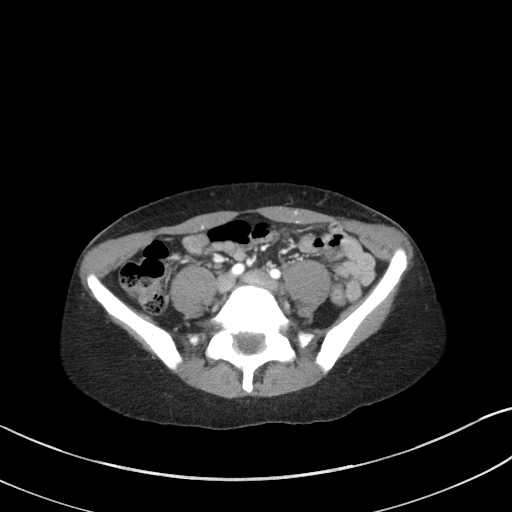
[im 46/83  soft-tissue]
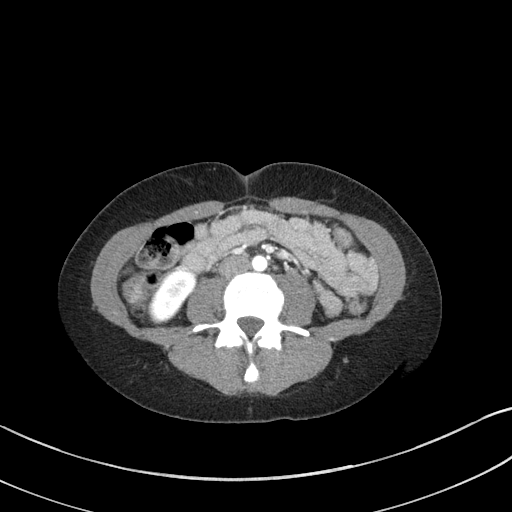
[im 50/83  soft-tissue]
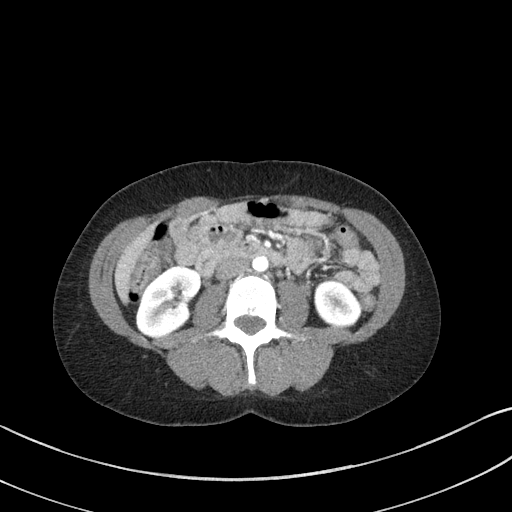
[im 50/83  bone]
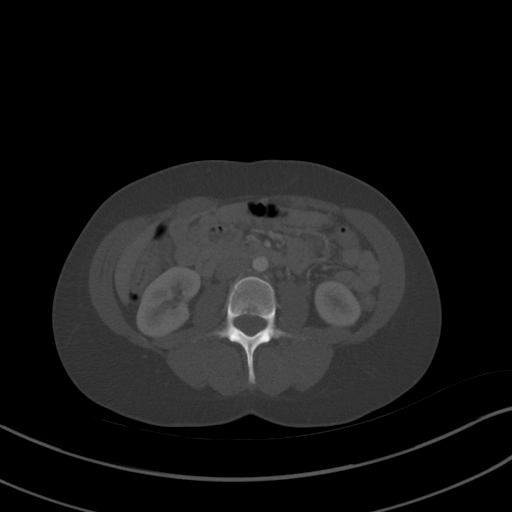
[im 54/83  soft-tissue]
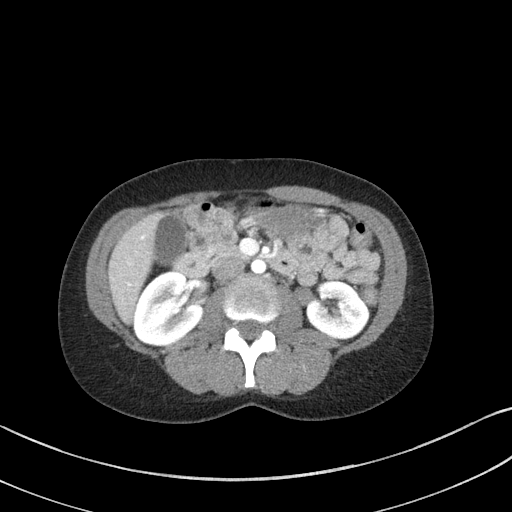
[im 62/83  soft-tissue]
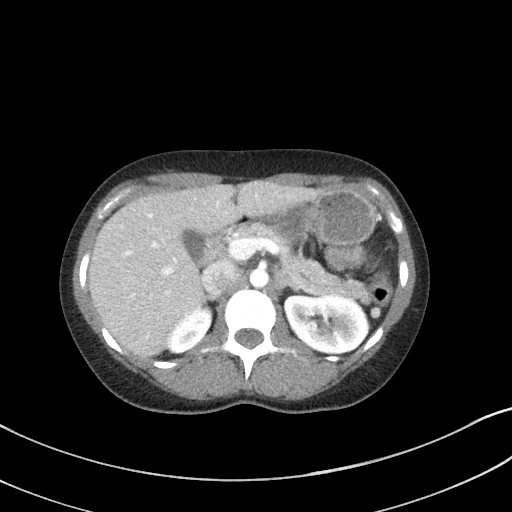
[im 66/83  soft-tissue]
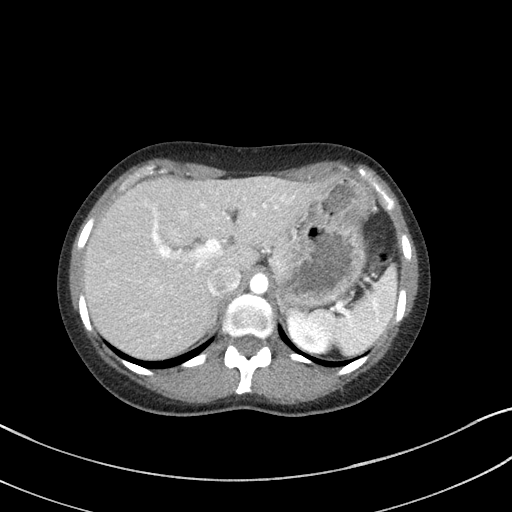
[im 70/83  soft-tissue]
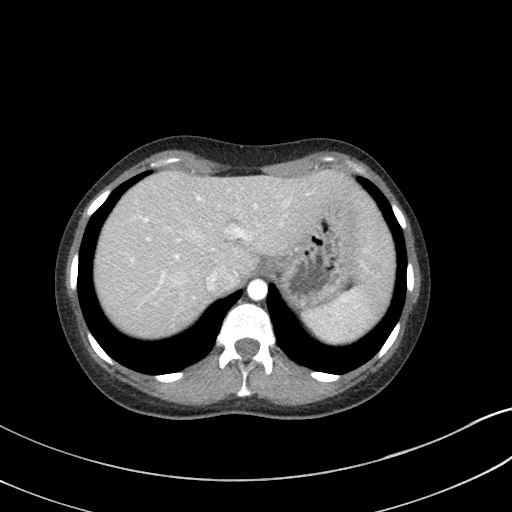
[im 78/83  soft-tissue]
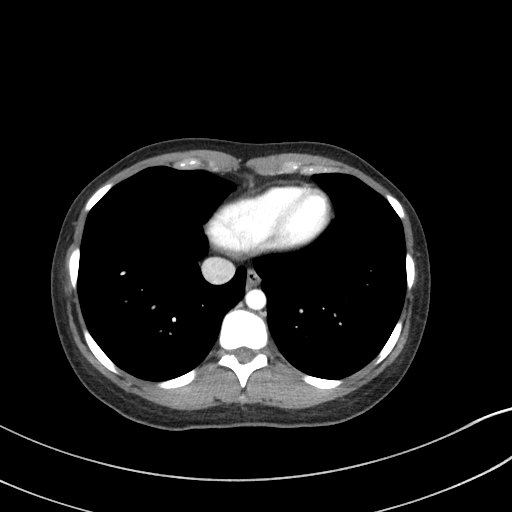

[Series 6: a/p w/ cor · coronal · 0.81mm/px · 3 of 126 slices shown]
[im 42/126  soft-tissue]
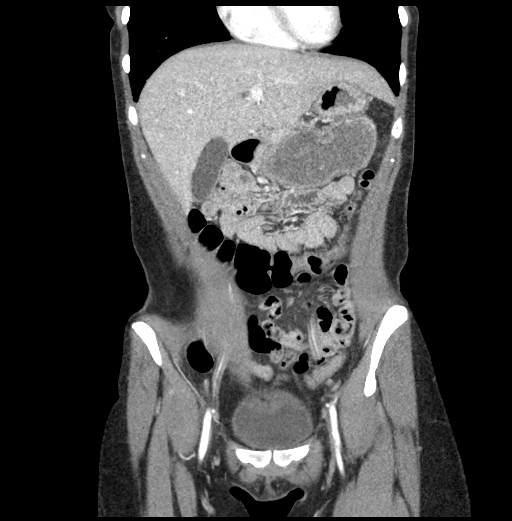
[im 56/126  soft-tissue]
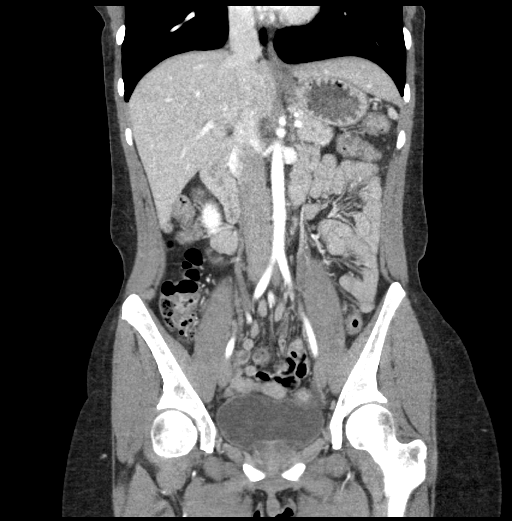
[im 70/126  soft-tissue]
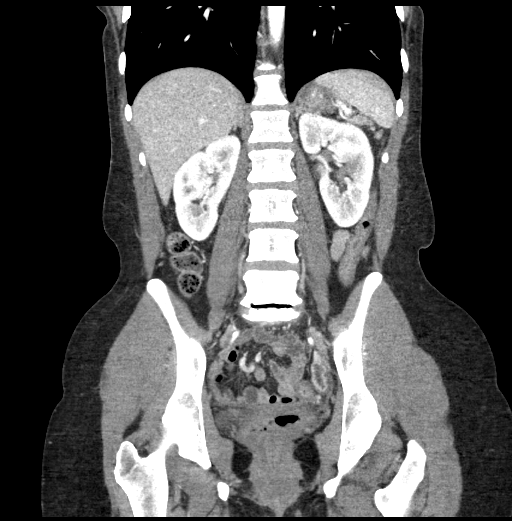

[17 of 46 positions shown; findings below may reference images not displayed]

FINDINGS: Lower chest: No acute abnormality.

Hepatobiliary: No focal liver abnormality is seen. No gallstones,
gallbladder wall thickening, or biliary dilatation.

Pancreas: Unremarkable. No pancreatic ductal dilatation or
surrounding inflammatory changes.

Spleen: Normal in size without focal abnormality.

Adrenals/Urinary Tract: Adrenal glands are unremarkable. Kidneys are
normal, without renal calculi, focal lesion, or hydronephrosis.
Bladder is unremarkable.

Stomach/Bowel: Stomach is within normal limits. Appendix appears
normal. No evidence of bowel wall thickening, distention, or
inflammatory changes.

Vascular/Lymphatic: No significant vascular findings are present. No
enlarged abdominal or pelvic lymph nodes.

Reproductive: Remote hysterectomy. Suspect small collapsing cyst or
follicle with peripheral enhancement of the left ovary,
approximately 2 x 1 cm, image 61. Trace physiologic free fluid
dependently in the pelvis. No fluid collection or abscess.

Other: No abdominal wall hernia or abnormality. No abdominopelvic
ascites.

Musculoskeletal: Advanced L5-S1 degenerative disc disease with
vacuum disc phenomena. No acute osseous finding.
IMPRESSION: No acute intra-abdominal or pelvic finding.

Remote hysterectomy

No acute inflammatory process, fluid collection or abscess

## 2018-11-13 ENCOUNTER — Other Ambulatory Visit: Payer: Self-pay | Admitting: Family Medicine

## 2018-11-13 DIAGNOSIS — Z1231 Encounter for screening mammogram for malignant neoplasm of breast: Secondary | ICD-10-CM

## 2018-12-10 ENCOUNTER — Ambulatory Visit: Payer: PRIVATE HEALTH INSURANCE

## 2019-01-30 ENCOUNTER — Ambulatory Visit: Payer: PRIVATE HEALTH INSURANCE

## 2019-03-08 ENCOUNTER — Ambulatory Visit (INDEPENDENT_AMBULATORY_CARE_PROVIDER_SITE_OTHER)
Admission: RE | Admit: 2019-03-08 | Discharge: 2019-03-08 | Disposition: A | Discharge status: Home / Self Care | Payer: BC Managed Care – PPO | Source: Ambulatory Visit

## 2019-03-08 DIAGNOSIS — M5441 Lumbago with sciatica, right side: Secondary | ICD-10-CM

## 2019-03-08 MED ORDER — CYCLOBENZAPRINE HCL 5 MG PO TABS: 5.0000 mg | ORAL_TABLET | Freq: Two times a day (BID) | ORAL | 0 refills | Status: DC | PRN

## 2019-03-08 MED ORDER — PREDNISONE 50 MG PO TABS: 50.0000 mg | ORAL_TABLET | Freq: Every day | ORAL | 0 refills | Status: AC

## 2019-03-08 NOTE — ED Provider Notes (Signed)
Virtual Visit via Video Note:  Laura Hudson  initiated request for Telemedicine visit with [facility] team. I connected with Laura Hudson  on 03/08/2019 at 11:33 AM  for a synchronized telemedicine visit using a video enabled HIPPA compliant telemedicine application. I verified that I am speaking with Laura Hudson  using two identifiers. Neshia Mckenzie C Renato Spellman, PA-C  was physically located in a [facility] site and Laura Hudson was located at a different location.   The limitations of evaluation and management by telemedicine as well as the availability of in-person appointments were discussed. Patient was informed that she  may incur a bill ( including co-pay) for this virtual visit encounter. Laura Hudson  expressed understanding and gave verbal consent to proceed with virtual visit.     History of Present Illness:Laura Hudson  is a 43 y.o. female presents for evaluation of back pain.  Patient states that she has had back pain for the past week.  States that she feels the pain in her bilateral lower back and extends and radiates into her right leg.  She notes that she has a tingling sensation that goes into her feet.  She denies any injury, increase in activity or heavy lifting that triggered her symptoms.  She was seen elsewhere through another virtual visit and treated with diclofenac.  She states that the diclofenac upset her stomach and caused nausea and vomiting.  She is no longer taking this.  She denies numbness in the groin region/saddle anesthesia.  Denies loss of bowel or bladder control.  Denies weakness in legs.  Past Medical History:  Diagnosis Date  . Anemia   . Anxiety   . Asthma   . Chronic bronchitis (HCC)    "couple times q yr" (03/17/2014)  . Diverticulitis   . IBS (irritable bowel syndrome)   . Migraines    "worse in the last 2 months; now at least 2 times/wk" (03/17/2014)  . Panic disorder     Allergies  Allergen Reactions  . Blueberry  [Vaccinium Angustifolium] Itching and Rash  . Cephalexin Rash        Observations/Objective:  Physical Exam  Constitutional: She is oriented to person, place, and time and well-developed, well-nourished, and in no distress. No distress.  HENT:  Head: Normocephalic and atraumatic.  Neck: Normal range of motion. Neck supple.  Pulmonary/Chest: Effort normal. No respiratory distress.  Speaking in full sentences  Musculoskeletal:     Comments: Moving all extremities appropriately  Neurological: She is alert and oriented to person, place, and time.  Speech clear, face symmetric   Assessment and Plan: Patient with lower back pain with right-sided radicular distribution, likely underlying sciatica.  Has been using NSAIDs without relief.  Will try prednisone as alternative.  Denies history of diabetes.  Also recommend muscle relaxers, provided Flexeril.  Gentle stretching, no red flags concerning for cauda equina.  Advised to follow-up in person if not having relief with oral steroids, may consider injections as alternative treatment.Discussed strict return precautions. Patient verbalized understanding and is agreeable with plan.   Follow Up Instructions:    I discussed the assessment and treatment plan with the patient. The patient was provided an opportunity to ask questions and all were answered. The patient agreed with the plan and demonstrated an understanding of the instructions.   The patient was advised to call back or seek an in-person evaluation if the symptoms worsen or if the condition fails to  improve as anticipated.      Janith Lima, PA-C  03/08/2019 11:33 AM         Janith Lima, PA-C 03/08/19 2003

## 2019-03-08 NOTE — Discharge Instructions (Signed)
Prednisone daily x 5 days  You may use flexeril as needed to help with pain. This is a muscle relaxer and causes sedation- please use only at bedtime or when you will be home and not have to drive/work After completing prednisone, Use anti-inflammatories for pain/swelling. You may take up to 800 mg Ibuprofen every 8 hours with food. You may supplement Ibuprofen with Tylenol [id number] mg every 8 hours.

## 2019-03-24 ENCOUNTER — Ambulatory Visit (INDEPENDENT_AMBULATORY_CARE_PROVIDER_SITE_OTHER)
Admission: RE | Admit: 2019-03-24 | Discharge: 2019-03-24 | Disposition: A | Discharge status: Home / Self Care | Payer: BC Managed Care – PPO | Source: Ambulatory Visit

## 2019-03-24 ENCOUNTER — Telehealth: Payer: Self-pay | Admitting: *Deleted

## 2019-03-24 DIAGNOSIS — Z20822 Contact with and (suspected) exposure to covid-19: Secondary | ICD-10-CM

## 2019-03-24 DIAGNOSIS — Z20828 Contact with and (suspected) exposure to other viral communicable diseases: Secondary | ICD-10-CM

## 2019-03-24 DIAGNOSIS — R51 Headache: Secondary | ICD-10-CM

## 2019-03-24 DIAGNOSIS — J029 Acute pharyngitis, unspecified: Secondary | ICD-10-CM

## 2019-03-24 DIAGNOSIS — B9789 Other viral agents as the cause of diseases classified elsewhere: Secondary | ICD-10-CM

## 2019-03-24 DIAGNOSIS — R197 Diarrhea, unspecified: Secondary | ICD-10-CM | POA: Diagnosis not present

## 2019-03-24 DIAGNOSIS — J069 Acute upper respiratory infection, unspecified: Secondary | ICD-10-CM

## 2019-03-24 MED ORDER — BENZONATATE 100 MG PO CAPS: 100.0000 mg | ORAL_CAPSULE | Freq: Three times a day (TID) | ORAL | 0 refills | Status: AC

## 2019-03-24 MED ORDER — FLUTICASONE PROPIONATE 50 MCG/ACT NA SUSP: 2.0000 | Freq: Every day | NASAL | 0 refills | Status: AC

## 2019-03-24 MED ORDER — CETIRIZINE-PSEUDOEPHEDRINE ER 5-120 MG PO TB12: 1.0000 | ORAL_TABLET | Freq: Every day | ORAL | 0 refills | Status: AC

## 2019-03-24 NOTE — Discharge Instructions (Signed)
COVID testing ordered.  Outpatient center will contact you regarding your appointment  In the meantime: You should remain isolated in your home for 7 days from symptom onset AND greater than 72 hours after symptoms resolution (absence of fever without the use of fever-reducing medication and improvement in respiratory symptoms), whichever is longer Get plenty of rest and push fluids Tessalon Perles prescribed for cough Zyrtec-D prescribed for nasal congestion, runny nose, and/or sore throat Flonase prescribed for nasal congestion and runny nose Use medications daily for symptom relief Use OTC medications like ibuprofen or tylenol as needed fever or pain Call or go to the ED if you have any new or worsening symptoms such as fever, worsening cough, shortness of breath, chest tightness, chest pain, turning blue, changes in mental status, etc..Marland Kitchen

## 2019-03-24 NOTE — Telephone Encounter (Signed)
Pt scheduled for Thursday at 10 am at [facility]. Advised that this is a drive thru testing site and to stay in car with mask on and windows rolled up until ready for testing. She voiced understanding.

## 2019-03-24 NOTE — Telephone Encounter (Signed)
-----   Message from Lestine Box, [place] sent at 03/24/2019  5:49 PM EDT ----- Regarding: COVID testing Viral illness with cough with possible COVID exposure.

## 2019-03-24 NOTE — ED Provider Notes (Signed)
[facility] Virtual Visit via Video Note:  Laura Hudson  initiated request for [facility]medicine visit with [facility]. I connected with Laura Hudson  on 03/24/2019 at 5:48 PM  for a synchronized telemedicine visit using a video enabled HIPPA compliant telemedicine application. I verified that I am speaking with Laura Hudson  using two identifiers. Rennis HardingBrittany Jozlin Bently, PA-C  was physically located in a [facility] site and Laura SidleShakera R Weill was located at a different location.   The limitations of evaluation and management by telemedicine as well as the availability of in-person appointments were discussed. Patient was informed that she  may incur a bill ( including co-pay) for this virtual visit encounter. Tildon HuskyShakera R Dolata  expressed understanding and gave verbal consent to proceed with virtual visit.   [id number] 03/24/19 Arrival Time: 1730  CC: URI symptoms   SUBJECTIVE: History from: patient.  Laura SidleShakera R Raabe is a 43 y.o. female hx significant for anemia, anxiety, asthma, chronic bronchitis, diverticulitis, IBS, migraines, and panic disorder, who presents with abrupt onset of runny nose, congestion, HA, mild clear productive cough, sore throat, fatigue, chest congestion, diarrhea (10 episodes of watery diarrhea over the past 4 days), and nausea that began 4 days ago.  Admits to possible COVID exposure.  Has tried OTC benadryl, claritin, dayquil and nyquil without relief.  Denies aggravating factors.  Reports previous symptoms in the past with URI and bronchitis.   Denies fever, chills, SOB, wheezing, chest pain,,changes in bladder habits.    ROS: As per HPI.  Past Medical History:  Diagnosis Date  . Anemia   . Anxiety   . Asthma   . Chronic bronchitis (HCC)    "couple times q yr" (03/17/2014)  . Diverticulitis   . IBS (irritable bowel syndrome)   . Migraines    "worse in the last 2 months; now at least 2 times/wk" (03/17/2014)  . Panic disorder    Past  Surgical History:  Procedure Laterality Date  . DILATION AND CURETTAGE OF UTERUS  1989  . LAPAROSCOPIC ABDOMINAL EXPLORATION  ~ 1989; ~ 2004  . TUBAL LIGATION  2005  . VAGINAL HYSTERECTOMY  2006   Allergies  Allergen Reactions  . Blueberry [Vaccinium Angustifolium] Itching and Rash  . Cephalexin Rash   No current facility-administered medications on file prior to encounter.    Current Outpatient Medications on File Prior to Encounter  Medication Sig Dispense Refill  . ALPRAZolam (XANAX) 0.5 MG tablet Take 0.5 mg by mouth 3 (three) times daily as needed for anxiety.    Marland Kitchen. buPROPion (WELLBUTRIN SR) 100 MG 12 hr tablet Take 100 mg by mouth 2 (two) times daily.    . [DISCONTINUED] omeprazole (PRILOSEC) 20 MG capsule Take 1 capsule (20 mg total) by mouth daily. 14 capsule 0  . [DISCONTINUED] promethazine (PHENERGAN) 25 MG tablet Take 1 tablet (25 mg total) by mouth every 6 (six) hours as needed for nausea or vomiting. 20 tablet 0    OBJECTIVE:   There were no vitals filed for this visit.  General appearance: alert; appears mildly fatigue, but nontoxic Eyes: EOMI grossly HENT: normocephalic; atraumatic Neck: supple with FROM Lungs: normal respiratory effort; speaking in full sentences without difficulty Extremities: moves extremities without difficulty Skin: No obvious rashes Neurologic: No facial asymmetries Psychological: alert and cooperative; normal mood and affect  ASSESSMENT & PLAN:  1. Suspected Covid-19 Virus Infection   2. Viral URI with cough     Meds ordered  this encounter  Medications  . cetirizine-pseudoephedrine (ZYRTEC-D) 5-120 MG tablet    Sig: Take 1 tablet by mouth daily.    Dispense:  30 tablet    Refill:  0    Order Specific Question:   Supervising Provider    Answer:   Raylene Everts [[id number]]  . fluticasone (FLONASE) 50 MCG/ACT nasal spray    Sig: Place 2 sprays into both nostrils daily.    Dispense:  16 g    Refill:  0    Order Specific  Question:   Supervising Provider    Answer:   Raylene Everts [[id number]]  . benzonatate (TESSALON) 100 MG capsule    Sig: Take 1 capsule (100 mg total) by mouth every 8 (eight) hours.    Dispense:  21 capsule    Refill:  0    Order Specific Question:   Supervising Provider    Answer:   Raylene Everts [[id number]]    COVID testing ordered.  Outpatient center will contact you regarding your appointment  In the meantime: You should remain isolated in your home for 7 days from symptom onset AND greater than 72 hours after symptoms resolution (absence of fever without the use of fever-reducing medication and improvement in respiratory symptoms), whichever is longer Get plenty of rest and push fluids Tessalon Perles prescribed for cough Zyrtec-D prescribed for nasal congestion, runny nose, and/or sore throat Flonase prescribed for nasal congestion and runny nose Use medications daily for symptom relief Use OTC medications like ibuprofen or tylenol as needed fever or pain Call or go to the ED if you have any new or worsening symptoms such as fever, worsening cough, shortness of breath, chest tightness, chest pain, turning blue, changes in mental status, etc...  I discussed the assessment and treatment plan with the patient. The patient was provided an opportunity to ask questions and all were answered. The patient agreed with the plan and demonstrated an understanding of the instructions.   The patient was advised to call back or seek an in-person evaluation if the symptoms worsen or if the condition fails to improve as anticipated.  I provided 15 minutes of non-face-to-face time during this encounter.  Lestine Box, PA-C  03/24/2019 5:48 PM          Lestine Box, PA-C 03/24/19 1748

## 2019-03-26 ENCOUNTER — Other Ambulatory Visit: Payer: BC Managed Care – PPO

## 2019-03-26 ENCOUNTER — Other Ambulatory Visit: Payer: Self-pay | Admitting: Internal Medicine

## 2019-03-26 DIAGNOSIS — Z20822 Contact with and (suspected) exposure to covid-19: Secondary | ICD-10-CM

## 2019-03-31 LAB — NOVEL CORONAVIRUS, NAA: SARS-CoV-2, NAA: NOT DETECTED

## 2019-12-26 ENCOUNTER — Other Ambulatory Visit: Payer: Self-pay

## 2019-12-26 ENCOUNTER — Emergency Department (HOSPITAL_COMMUNITY): Payer: BC Managed Care – PPO

## 2019-12-26 ENCOUNTER — Encounter (HOSPITAL_COMMUNITY): Payer: Self-pay

## 2019-12-26 ENCOUNTER — Emergency Department (HOSPITAL_COMMUNITY)
Admission: EM | Admit: 2019-12-26 | Discharge: 2019-12-26 | Disposition: A | Discharge status: Home / Self Care | Payer: BC Managed Care – PPO | Attending: Emergency Medicine | Admitting: Emergency Medicine

## 2019-12-26 DIAGNOSIS — F1210 Cannabis abuse, uncomplicated: Secondary | ICD-10-CM | POA: Insufficient documentation

## 2019-12-26 DIAGNOSIS — Z20822 Contact with and (suspected) exposure to covid-19: Secondary | ICD-10-CM | POA: Insufficient documentation

## 2019-12-26 DIAGNOSIS — R112 Nausea with vomiting, unspecified: Secondary | ICD-10-CM | POA: Insufficient documentation

## 2019-12-26 DIAGNOSIS — H65192 Other acute nonsuppurative otitis media, left ear: Secondary | ICD-10-CM | POA: Insufficient documentation

## 2019-12-26 DIAGNOSIS — R197 Diarrhea, unspecified: Secondary | ICD-10-CM | POA: Insufficient documentation

## 2019-12-26 DIAGNOSIS — F17210 Nicotine dependence, cigarettes, uncomplicated: Secondary | ICD-10-CM | POA: Insufficient documentation

## 2019-12-26 DIAGNOSIS — Z79899 Other long term (current) drug therapy: Secondary | ICD-10-CM | POA: Insufficient documentation

## 2019-12-26 LAB — COMPREHENSIVE METABOLIC PANEL
ALT: 37 U/L (ref 0–44)
AST: 31 U/L (ref 15–41)
Albumin: 4.6 g/dL (ref 3.5–5.0)
Alkaline Phosphatase: 65 U/L (ref 38–126)
Anion gap: 9 (ref 5–15)
BUN: 7 mg/dL (ref 6–20)
CO2: 25 mmol/L (ref 22–32)
Calcium: 9.4 mg/dL (ref 8.9–10.3)
Chloride: 106 mmol/L (ref 98–111)
Creatinine, Ser: 0.66 mg/dL (ref 0.44–1.00)
GFR calc Af Amer: >60 mL/min (ref >60)
GFR calc non Af Amer: >60 mL/min (ref >60)
Glucose, Bld: 86 mg/dL (ref 70–99)
Potassium: 4.4 mmol/L (ref 3.5–5.1)
Sodium: 140 mmol/L (ref 135–145)
Total Bilirubin: 0.4 mg/dL (ref 0.3–1.2)
Total Protein: 7.7 g/dL (ref 6.5–8.1)

## 2019-12-26 LAB — CBC WITH DIFFERENTIAL/PLATELET
Abs Immature Granulocytes: 0.02 10*3/uL (ref 0.00–0.07)
Basophils Absolute: 0.1 10*3/uL (ref 0.0–0.1)
Basophils Relative: 1 %
Eosinophils Absolute: 0.1 10*3/uL (ref 0.0–0.5)
Eosinophils Relative: 1 %
HCT: 41.4 % (ref 36.0–46.0)
Hemoglobin: 13.1 g/dL (ref 12.0–15.0)
Immature Granulocytes: 0 %
Lymphocytes Relative: 27 %
Lymphs Abs: 2.2 10*3/uL (ref 0.7–4.0)
MCH: 28.9 pg (ref 26.0–34.0)
MCHC: 31.6 g/dL (ref 30.0–36.0)
MCV: 91.4 fL (ref 80.0–100.0)
Monocytes Absolute: 0.4 10*3/uL (ref 0.1–1.0)
Monocytes Relative: 5 %
Neutro Abs: 5.3 10*3/uL (ref 1.7–7.7)
Neutrophils Relative %: 66 %
Platelets: 405 10*3/uL — ABNORMAL HIGH (ref 150–400)
RBC: 4.53 MIL/uL (ref 3.87–5.11)
RDW: 14.5 % (ref 11.5–15.5)
WBC: 8.1 10*3/uL (ref 4.0–10.5)
nRBC: 0 % (ref 0.0–0.2)

## 2019-12-26 LAB — SARS CORONAVIRUS 2 (TAT 6-24 HRS): SARS Coronavirus 2: NEGATIVE

## 2019-12-26 MED ORDER — SODIUM CHLORIDE 0.9 % IV BOLUS
1000.0000 mL | Freq: Once | INTRAVENOUS | Status: AC
  Administered 2019-12-26: 13:00:00 1000 mL via INTRAVENOUS

## 2019-12-26 MED ORDER — DIPHENHYDRAMINE HCL 50 MG/ML IJ SOLN
25.0000 mg | Freq: Once | INTRAMUSCULAR | Status: AC
  Administered 2019-12-26: 13:00:00 25 mg via INTRAVENOUS
  Filled 2019-12-26: qty 1

## 2019-12-26 MED ORDER — SODIUM CHLORIDE 0.9% FLUSH: 3.0000 mL | Freq: Once | INTRAVENOUS | Status: DC

## 2019-12-26 MED ORDER — ONDANSETRON 4 MG PO TBDP: 4.0000 mg | ORAL_TABLET | Freq: Three times a day (TID) | ORAL | 0 refills | Status: AC | PRN

## 2019-12-26 MED ORDER — METOCLOPRAMIDE HCL 5 MG/ML IJ SOLN
10.0000 mg | Freq: Once | INTRAMUSCULAR | Status: AC
  Administered 2019-12-26: 10 mg via INTRAVENOUS
  Filled 2019-12-26: qty 2

## 2019-12-26 NOTE — Discharge Instructions (Signed)
Take the Zofran as needed to help with your nausea. You can continue taking the Flonase and antihistamines as needed to help with your ear pain. Return to the ED if you start to experience worsening vomiting despite taking the Zofran, chest pain, shortness of breath, severe abdominal pain. We will contact you with results of your Covid test when it is available.

## 2019-12-26 NOTE — ED Triage Notes (Signed)
Patient c/o left eat pain that radiates into her left jaw area, dizziness, and N/V x 4 days.  Patient states she went to a wedding reception a week ago and 2 persons that were at her table tested Covid + a few days ago.

## 2019-12-26 NOTE — ED Provider Notes (Signed)
[place] DEPT Provider Note   CSN: [id number] Arrival date & time: 12/26/19  1030     History Chief Complaint  Patient presents with  . Otalgia  . Emesis    Laura Hudson is a 44 y.o. female with a past medical history of IBS, asthma, presenting to the ED with multiple complaints. Two weeks ago started having "allergy" symptoms including rhinorrhea, itchy and watery eyes and noted only minimal improvement with over the counter antihistamines. Went to a wedding reception about 1 week ago and had a known COVID exposure. Has been having progressively worsening L ear pain. Describes the pain as sharp, radiating throughout her entire L side of the head, and causing her to have a migraine headache.  She does state that her allergy symptoms have improved recently with antihistamines.  Two days ago started having several episodes of nonbloody, nonbilious emesis, and then began having diarrhea today.  She states that cough from her allergy symptoms has gradually improved.  She denies any chest pain, abdominal pain, wheezing, drainage from the ear.  HPI     Past Medical History:  Diagnosis Date  . Anemia   . Anxiety   . Asthma   . Chronic bronchitis ([place])    "couple times q yr" (03/17/2014)  . Diverticulitis   . IBS (irritable bowel syndrome)   . Migraines    "worse in the last 2 months; now at least 2 times/wk" (03/17/2014)  . Panic disorder     Patient Active Problem List   Diagnosis Date Noted  . Ileitis 03/17/2014  . Colitis, acute 03/17/2014  . Leukocytosis, unspecified 03/17/2014  . Colitis 03/17/2014  . IBS (irritable bowel syndrome)   . Anxiety   . Panic disorder   . Migraines   . IRRITABLE BOWEL SYNDROME 11/03/2009  . WEIGHT LOSS 09/12/2009  . DIARRHEA 09/12/2009  . ABDOMINAL PAIN OTHER SPECIFIED SITE 09/12/2009  . ANEMIA-NOS 12/30/2008  . ABDOMINAL PAIN, GENERALIZED 12/30/2008  . BLOOD IN STOOL, OCCULT 12/30/2008    Past Surgical  History:  Procedure Laterality Date  . DILATION AND CURETTAGE OF UTERUS  1989  . LAPAROSCOPIC ABDOMINAL EXPLORATION  ~ 1989; ~ 2004  . TUBAL LIGATION  2005  . VAGINAL HYSTERECTOMY  2006     OB History    Gravida  5   Para  4   Term      Preterm      AB  1   Living  4     SAB  1   TAB      Ectopic      Multiple      Live Births              Family History  Problem Relation Age of Onset  . Hypertension Mother   . Diabetes Mother   . High Cholesterol Mother   . Hypertension Father   . Diabetes Father   . High Cholesterol Father     Social History   Tobacco Use  . Smoking status: Current Some Day Smoker    Packs/day: 0.50    Years: 13.00    Pack years: 6.50    Types: Cigarettes  . Smokeless tobacco: Never Used  Substance Use Topics  . Alcohol use: Yes    Comment: 03/17/2014 "might have a drink on the holiday"  . Drug use: Yes    Types: Marijuana    Comment: 03/17/2014 "smoke marijuana 1-2 times/month"    Home Medications Prior to  Admission medications   Medication Sig Start Date End Date Taking? Authorizing Provider  ALPRAZolam Prudy Feeler) 0.5 MG tablet Take 0.5 mg by mouth 3 (three) times daily as needed for anxiety.    [provider]  benzonatate (TESSALON) 100 MG capsule Take 1 capsule (100 mg total) by mouth every 8 (eight) hours. 03/24/19   Wurst, [place], PA-C  buPROPion (WELLBUTRIN SR) 100 MG 12 hr tablet Take 100 mg by mouth 2 (two) times daily.    [provider]  cetirizine-pseudoephedrine (ZYRTEC-D) 5-120 MG tablet Take 1 tablet by mouth daily. 03/24/19   Wurst, [place], PA-C  fluticasone (FLONASE) 50 MCG/ACT nasal spray Place 2 sprays into both nostrils daily. 03/24/19   Wurst, [place], PA-C  ondansetron (ZOFRAN ODT) 4 MG disintegrating tablet Take 1 tablet (4 mg total) by mouth every 8 (eight) hours as needed for nausea or vomiting. 12/26/19   Ewart Carrera, PA-C  omeprazole (PRILOSEC) 20 MG capsule Take 1 capsule (20 mg  total) by mouth daily. 12/12/16 03/08/19  Deatra Canter, FNP  promethazine (PHENERGAN) 25 MG tablet Take 1 tablet (25 mg total) by mouth every 6 (six) hours as needed for nausea or vomiting. 03/24/17 03/08/19  Street, Rosine, PA-C    Allergies    Blueberry [vaccinium angustifolium] and Cephalexin  Review of Systems   Review of Systems  Constitutional: Negative for appetite change, chills and fever.  HENT: Positive for ear pain and rhinorrhea. Negative for ear discharge, sneezing and sore throat.   Eyes: Negative for photophobia and visual disturbance.  Respiratory: Negative for cough, chest tightness, shortness of breath and wheezing.   Cardiovascular: Negative for chest pain and palpitations.  Gastrointestinal: Positive for diarrhea, nausea and vomiting. Negative for abdominal pain, blood in stool and constipation.  Genitourinary: Negative for dysuria, hematuria and urgency.  Musculoskeletal: Negative for myalgias.  Skin: Negative for rash.  Neurological: Negative for dizziness, weakness and light-headedness.    Physical Exam Updated Vital Signs BP 114/76 (BP Location: Left Arm)   Pulse 75   Temp 98.3 F (36.8 C) (Oral)   Resp 14   Ht 5\' 3"  (1.6 m)   Wt 58.1 kg   LMP 10/03/2011   SpO2 99%   BMI 22.67 kg/m   Physical Exam Vitals and nursing note reviewed.  Constitutional:      General: She is not in acute distress.    Appearance: She is well-developed.  HENT:     Head: Normocephalic and atraumatic.     Right Ear: There is impacted cerumen.     Left Ear: No drainage. A middle ear effusion is present. No mastoid tenderness. Tympanic membrane is not perforated, erythematous or retracted.     Nose: Nose normal.  Eyes:     General: No scleral icterus.       Left eye: No discharge.     Conjunctiva/sclera: Conjunctivae normal.  Cardiovascular:     Rate and Rhythm: Normal rate and regular rhythm.     Heart sounds: Normal heart sounds. No murmur. No friction rub. No gallop.    Pulmonary:     Effort: Pulmonary effort is normal. No respiratory distress.     Breath sounds: Normal breath sounds.  Abdominal:     General: Bowel sounds are normal. There is no distension.     Palpations: Abdomen is soft.     Tenderness: There is no abdominal tenderness. There is no guarding.  Musculoskeletal:        General: Normal range of motion.  Cervical back: Normal range of motion and neck supple.  Skin:    General: Skin is warm and dry.     Findings: No rash.  Neurological:     General: No focal deficit present.     Mental Status: She is alert and oriented to person, place, and time.     Cranial Nerves: No cranial nerve deficit.     Sensory: No sensory deficit.     Motor: No weakness or abnormal muscle tone.     Coordination: Coordination normal.     ED Results / Procedures / Treatments   Labs (all labs ordered are listed, but only abnormal results are displayed) Labs Reviewed  CBC WITH DIFFERENTIAL/PLATELET - Abnormal; Notable for the following components:      Result Value   Platelets 405 (*)    All other components within normal limits  SARS CORONAVIRUS 2 (TAT 6-24 HRS)  COMPREHENSIVE METABOLIC PANEL    EKG None  Radiology DG Chest Portable 1 View  Result Date: 12/26/2019 CLINICAL DATA:  COVID-19 exposure, dizziness, ear pain, nausea and vomiting EXAM: PORTABLE CHEST 1 VIEW COMPARISON:  10/10/2010 chest radiograph. FINDINGS: Stable cardiomediastinal silhouette with normal heart size. No pneumothorax. No pleural effusion. Lungs appear clear, with no acute consolidative airspace disease and no pulmonary edema. IMPRESSION: No active disease. Electronically Signed   By: Delbert Phenix M.D.   On: 12/26/2019 12:33    Procedures Procedures (including critical care time)  Medications Ordered in ED Medications  sodium chloride flush (NS) 0.9 % injection 3 mL (has no administration in time range)  sodium chloride 0.9 % bolus 1,000 mL (1,000 mLs Intravenous New  Bag/Given 12/26/19 1230)  metoCLOPramide (REGLAN) injection 10 mg (10 mg Intravenous Given 12/26/19 1230)  diphenhydrAMINE (BENADRYL) injection 25 mg (25 mg Intravenous Given 12/26/19 1230)    ED Course  I have reviewed the triage vital signs and the nursing notes.  Pertinent labs & imaging results that were available during my care of the patient were reviewed by me and considered in my medical decision making (see chart for details).    MDM Rules/Calculators/A&P                      44 year old female with past medical history of IBS, asthma presenting to the ED with multiple complaints.  Essentially she has been having left-sided ear pain that has progressively worsened and now causing her to have a headache.  She also been having GI symptoms including diarrhea that beginning today as well as emesis that began 2 days ago.  She did have a known Covid exposure approximately 1 week ago at a wedding.  Overall her allergy symptoms have improved with over-the-counter antihistamines.  On exam there is left TM effusion without signs of infection or perforation.  No external tenderness that would concern me for otitis externa.  No drainage.  Abdomen is soft, nontender nondistended.  Neurological exam without any deficits.  Chest x-ray without any acute findings.  Lab work including CBC, CMP unremarkable.  Patient was given IV fluids, migraine cocktail with improvement in her symptoms. Suspect that the headache could be due to her migraines (reports history) or radiating pain from her ear. There are no headache characteristics that are lateralizing or concerning for increased ICP, infectious or vascular cause of her symptoms. COVID test is pending.  We will have her follow-up with these results when they are available.  In the meantime we will have her continue antiemetics, increase  hydration and advance her diet as tolerated.  I have low suspicion for cholecystitis, appendicitis other emergent or surgical cause  of her symptoms. Patient agreeable to plan.  All imaging, if done today, including plain films, CT scans, and ultrasounds, independently reviewed by me, and interpretations confirmed via formal radiology reads.  Patient is hemodynamically stable, in NAD, and able to ambulate in the ED. Evaluation does not show pathology that would require ongoing emergent intervention or inpatient treatment. I explained the diagnosis to the patient. Pain has been managed and has no complaints prior to discharge. Patient is comfortable with above plan and is stable for discharge at this time. All questions were answered prior to disposition. Strict return precautions for returning to the ED were discussed. Encouraged follow up with PCP.   An After Visit Summary was printed and given to the patient.   Portions of this note were generated with Scientist, clinical (histocompatibility and immunogenetics). Dictation errors may occur despite best attempts at proofreading.  Final Clinical Impression(s) / ED Diagnoses Final diagnoses:  Suspected COVID-19 virus infection  Nausea vomiting and diarrhea  Acute effusion of left ear    Rx / DC Orders ED Discharge Orders         Ordered    ondansetron (ZOFRAN ODT) 4 MG disintegrating tablet  Every 8 hours PRN     12/26/19 1337           Dietrich Pates, PA-C 12/26/19 1339    Raeford Razor, MD 12/30/19 [phone]

## 2019-12-26 NOTE — ED Notes (Signed)
I attempted to collect labs and did not feel anything to collect from stated stated she has not eaten in a few days

## 2021-08-02 ENCOUNTER — Encounter (HOSPITAL_COMMUNITY): Payer: Self-pay | Admitting: Emergency Medicine

## 2021-08-02 ENCOUNTER — Ambulatory Visit (HOSPITAL_COMMUNITY)
Admission: EM | Admit: 2021-08-02 | Discharge: 2021-08-02 | Disposition: A | Discharge status: Home / Self Care | Payer: Self-pay | Attending: Family Medicine | Admitting: Family Medicine

## 2021-08-02 ENCOUNTER — Other Ambulatory Visit: Payer: Self-pay

## 2021-08-02 DIAGNOSIS — G5701 Lesion of sciatic nerve, right lower limb: Secondary | ICD-10-CM

## 2021-08-02 DIAGNOSIS — K580 Irritable bowel syndrome with diarrhea: Secondary | ICD-10-CM

## 2021-08-02 MED ORDER — DICYCLOMINE HCL 10 MG PO CAPS: 10.0000 mg | ORAL_CAPSULE | Freq: Three times a day (TID) | ORAL | 2 refills | Status: DC

## 2021-08-02 MED ORDER — DEXAMETHASONE SODIUM PHOSPHATE 10 MG/ML IJ SOLN
10.0000 mg | Freq: Once | INTRAMUSCULAR | Status: AC
  Administered 2021-08-02: 10 mg via INTRAMUSCULAR

## 2021-08-02 MED ORDER — CYCLOBENZAPRINE HCL 10 MG PO TABS: 10.0000 mg | ORAL_TABLET | Freq: Three times a day (TID) | ORAL | 0 refills | Status: AC | PRN

## 2021-08-02 MED ORDER — DEXAMETHASONE SODIUM PHOSPHATE 10 MG/ML IJ SOLN
INTRAMUSCULAR | Status: AC
  Filled 2021-08-02: qty 1

## 2021-08-02 NOTE — ED Provider Notes (Signed)
[place]    CSN: [id number] Arrival date & time: 08/02/21  [id number]      History   Chief Complaint Chief Complaint  Patient presents with   Back Pain   Leg Pain   Abdominal Pain    HPI NAZARENE Hudson is a 45 y.o. female.   Presenting today with multiple complaints.  She states that she has been having IBS flares off and on for the past 2 years since being off of insurance and unable to get her Bentyl prescribed.  She is been having abdominal cramping, diarrhea for the past 1 week that has become worse since taking BC powders for her back soreness.  She is not currently on anything for symptoms.  She will states she has been moving the past few days and has been caring mattresses and heavy TVs, having right low back soreness extending down to right lateral leg with pain shooting down the posterior leg.  States she gets this from time to time.  Denies bowel or bladder incontinence, fevers, chills, leg weakness, gait change.  Has not been trying anything other than BC powders which are not helping.  No past history of back injuries.   Past Medical History:  Diagnosis Date   Anemia    Anxiety    Asthma    Chronic bronchitis ([place])    "couple times q yr" (03/17/2014)   Diverticulitis    IBS (irritable bowel syndrome)    Migraines    "worse in the last 2 months; now at least 2 times/wk" (03/17/2014)   Panic disorder     Patient Active Problem List   Diagnosis Date Noted   Ileitis 03/17/2014   Colitis, acute 03/17/2014   Leukocytosis, unspecified 03/17/2014   Colitis 03/17/2014   IBS (irritable bowel syndrome)    Anxiety    Panic disorder    Migraines    IRRITABLE BOWEL SYNDROME 11/03/2009   WEIGHT LOSS 09/12/2009   DIARRHEA 09/12/2009   ABDOMINAL PAIN OTHER SPECIFIED SITE 09/12/2009   ANEMIA-NOS 12/30/2008   ABDOMINAL PAIN, GENERALIZED 12/30/2008   BLOOD IN STOOL, OCCULT 12/30/2008    Past Surgical History:  Procedure Laterality Date   DILATION AND CURETTAGE  OF UTERUS  1989   LAPAROSCOPIC ABDOMINAL EXPLORATION  ~ 1989; ~ 2004   TUBAL LIGATION  2005   VAGINAL HYSTERECTOMY  2006    OB History     Gravida  5   Para  4   Term      Preterm      AB  1   Living  4      SAB  1   IAB      Ectopic      Multiple      Live Births               Home Medications    Prior to Admission medications   Medication Sig Start Date End Date Taking? Authorizing Provider  cyclobenzaprine (FLEXERIL) 10 MG tablet Take 1 tablet (10 mg total) by mouth 3 (three) times daily as needed for muscle spasms. Do not drink alcohol or drive while taking this medication.  May cause drowsiness. 08/02/21  Yes Volney American, PA-C  dicyclomine (BENTYL) 10 MG capsule Take 1 capsule (10 mg total) by mouth 4 (four) times daily -  before meals and at bedtime. 08/02/21  Yes Volney American, PA-C  ALPRAZolam Duanne Moron) 0.5 MG tablet Take 0.5 mg by mouth 3 (three) times daily as  needed for anxiety.    [provider]  benzonatate (TESSALON) 100 MG capsule Take 1 capsule (100 mg total) by mouth every 8 (eight) hours. 03/24/19   Wurst, [place], PA-C  buPROPion (WELLBUTRIN SR) 100 MG 12 hr tablet Take 100 mg by mouth 2 (two) times daily.    [provider]  cetirizine-pseudoephedrine (ZYRTEC-D) 5-120 MG tablet Take 1 tablet by mouth daily. 03/24/19   Wurst, [place], PA-C  fluticasone (FLONASE) 50 MCG/ACT nasal spray Place 2 sprays into both nostrils daily. 03/24/19   Wurst, [place], PA-C  ondansetron (ZOFRAN ODT) 4 MG disintegrating tablet Take 1 tablet (4 mg total) by mouth every 8 (eight) hours as needed for nausea or vomiting. 12/26/19   Khatri, Hina, PA-C  omeprazole (PRILOSEC) 20 MG capsule Take 1 capsule (20 mg total) by mouth daily. 12/12/16 03/08/19  Deatra Canter, FNP  promethazine (PHENERGAN) 25 MG tablet Take 1 tablet (25 mg total) by mouth every 6 (six) hours as needed for nausea or vomiting. 03/24/17 03/08/19  Street, [place],  PA-C    Family History Family History  Problem Relation Age of Onset   Hypertension Mother    Diabetes Mother    High Cholesterol Mother    Hypertension Father    Diabetes Father    High Cholesterol Father     Social History Social History   Tobacco Use   Smoking status: Some Days    Packs/day: 0.50    Years: 13.00    Pack years: 6.50    Types: Cigarettes   Smokeless tobacco: Never  Vaping Use   Vaping Use: Never used  Substance Use Topics   Alcohol use: Yes    Comment: 03/17/2014 "might have a drink on the holiday"   Drug use: Yes    Types: Marijuana    Comment: 03/17/2014 "smoke marijuana 1-2 times/month"     Allergies   Blueberry [vaccinium angustifolium] and Cephalexin   Review of Systems Review of Systems Per HPI  Physical Exam Triage Vital Signs ED Triage Vitals  Enc Vitals Group     BP 08/02/21 0954 (!) 124/99     Pulse Rate 08/02/21 0954 77     Resp 08/02/21 0954 16     Temp 08/02/21 0954 98.3 F (36.8 C)     Temp Source 08/02/21 0954 Oral     SpO2 08/02/21 0954 99 %     Weight --      Height --      Head Circumference --      Peak Flow --      Pain Score 08/02/21 0953 8     Pain Loc --      Pain Edu? --      Excl. in GC? --    No data found.  Updated Vital Signs BP (!) 124/99   Pulse 77   Temp 98.3 F (36.8 C) (Oral)   Resp 16   LMP 10/03/2011   SpO2 99%   Visual Acuity Right Eye Distance:   Left Eye Distance:   Bilateral Distance:    Right Eye Near:   Left Eye Near:    Bilateral Near:     Physical Exam Vitals and nursing note reviewed.  Constitutional:      Appearance: Normal appearance. She is not ill-appearing.  HENT:     Head: Atraumatic.     Mouth/Throat:     Mouth: Mucous membranes are moist.  Eyes:     Extraocular Movements: Extraocular movements intact.  Conjunctiva/sclera: Conjunctivae normal.  Cardiovascular:     Rate and Rhythm: Normal rate and regular rhythm.     Heart sounds: Normal heart sounds.   Pulmonary:     Effort: Pulmonary effort is normal.     Breath sounds: Normal breath sounds.  Abdominal:     General: Bowel sounds are normal. There is no distension.     Palpations: Abdomen is soft.     Tenderness: There is no abdominal tenderness. There is no guarding.  Musculoskeletal:        General: Tenderness present. No swelling. Normal range of motion.     Cervical back: Normal range of motion and neck supple.     Comments: Right lumbar paraspinal muscles tender to palpation extending down right piriformis.  Normal gait.  No midline spinal tenderness to palpation diffusely.  Negative straight leg raise bilaterally  Skin:    General: Skin is warm and dry.  Neurological:     Mental Status: She is alert and oriented to person, place, and time.     Comments: Bilateral lower extremities neurovascularly intact  Psychiatric:        Mood and Affect: Mood normal.        Thought Content: Thought content normal.        Judgment: Judgment normal.     UC Treatments / Results  Labs (all labs ordered are listed, but only abnormal results are displayed) Labs Reviewed - No data to display  EKG   Radiology No results found.  Procedures Procedures (including critical care time)  Medications Ordered in UC Medications  dexamethasone (DECADRON) injection 10 mg (10 mg Intramuscular Given 08/02/21 1029)    Initial Impression / Assessment and Plan / UC Course  I have reviewed the triage vital signs and the nursing notes.  Pertinent labs & imaging results that were available during my care of the patient were reviewed by me and considered in my medical decision making (see chart for details).     We will treat with Bentyl for IBS flare and as needed Imodium, brat diet.  IM Decadron given for piriformis syndrome with some sciatica symptoms, Flexeril sent for as needed use additionally.  Discussed stretches and rest.  Return for worsening symptoms.  Work note given.  Final Clinical  Impressions(s) / UC Diagnoses   Final diagnoses:  Irritable bowel syndrome with diarrhea  Piriformis syndrome of right side   Discharge Instructions   None    ED Prescriptions     Medication Sig Dispense Auth. Provider   cyclobenzaprine (FLEXERIL) 10 MG tablet Take 1 tablet (10 mg total) by mouth 3 (three) times daily as needed for muscle spasms. Do not drink alcohol or drive while taking this medication.  May cause drowsiness. 15 tablet Volney American, [place]   dicyclomine (BENTYL) 10 MG capsule Take 1 capsule (10 mg total) by mouth 4 (four) times daily -  before meals and at bedtime. 90 capsule Volney American, [place]      PDMP not reviewed this encounter.   Volney American, [place] 08/02/21 1051

## 2021-08-02 NOTE — ED Triage Notes (Signed)
PT reports history of IBS, has had abdominal cramping for 1 week of so, had had loose stools.   Reports back pain that is affecting hips and legs. She is moving and believes back pain is associated with trying to hang a TV

## 2022-06-24 ENCOUNTER — Emergency Department (HOSPITAL_COMMUNITY): Payer: BC Managed Care – PPO

## 2022-06-24 ENCOUNTER — Emergency Department (HOSPITAL_COMMUNITY)
Admission: EM | Admit: 2022-06-24 | Discharge: 2022-06-24 | Disposition: A | Discharge status: Home / Self Care | Payer: BC Managed Care – PPO | Attending: Emergency Medicine | Admitting: Emergency Medicine

## 2022-06-24 ENCOUNTER — Encounter (HOSPITAL_COMMUNITY): Payer: Self-pay | Admitting: Emergency Medicine

## 2022-06-24 ENCOUNTER — Other Ambulatory Visit: Payer: Self-pay

## 2022-06-24 DIAGNOSIS — R110 Nausea: Secondary | ICD-10-CM | POA: Diagnosis not present

## 2022-06-24 DIAGNOSIS — R109 Unspecified abdominal pain: Secondary | ICD-10-CM

## 2022-06-24 DIAGNOSIS — J45909 Unspecified asthma, uncomplicated: Secondary | ICD-10-CM | POA: Diagnosis not present

## 2022-06-24 DIAGNOSIS — R1032 Left lower quadrant pain: Secondary | ICD-10-CM | POA: Insufficient documentation

## 2022-06-24 DIAGNOSIS — F17210 Nicotine dependence, cigarettes, uncomplicated: Secondary | ICD-10-CM | POA: Diagnosis not present

## 2022-06-24 LAB — CBC
HCT: 42.6 % (ref 36.0–46.0)
Hemoglobin: 14.1 g/dL (ref 12.0–15.0)
MCH: 29.5 pg (ref 26.0–34.0)
MCHC: 33.1 g/dL (ref 30.0–36.0)
MCV: 89.1 fL (ref 80.0–100.0)
Platelets: 374 10*3/uL (ref 150–400)
RBC: 4.78 MIL/uL (ref 3.87–5.11)
RDW: 16 % — ABNORMAL HIGH (ref 11.5–15.5)
WBC: 9.6 10*3/uL (ref 4.0–10.5)
nRBC: 0 % (ref 0.0–0.2)

## 2022-06-24 LAB — COMPREHENSIVE METABOLIC PANEL
ALT: 21 U/L (ref 0–44)
AST: 21 U/L (ref 15–41)
Albumin: 4.6 g/dL (ref 3.5–5.0)
Alkaline Phosphatase: 70 U/L (ref 38–126)
Anion gap: 10 (ref 5–15)
BUN: 8 mg/dL (ref 6–20)
CO2: 22 mmol/L (ref 22–32)
Calcium: 9.7 mg/dL (ref 8.9–10.3)
Chloride: 106 mmol/L (ref 98–111)
Creatinine, Ser: 0.69 mg/dL (ref 0.44–1.00)
GFR, Estimated: >60 mL/min (ref >60)
Glucose, Bld: 93 mg/dL (ref 70–99)
Potassium: 3.9 mmol/L (ref 3.5–5.1)
Sodium: 138 mmol/L (ref 135–145)
Total Bilirubin: 0.5 mg/dL (ref 0.3–1.2)
Total Protein: 7.7 g/dL (ref 6.5–8.1)

## 2022-06-24 LAB — URINALYSIS, ROUTINE W REFLEX MICROSCOPIC
Bilirubin Urine: NEGATIVE
Glucose, UA: NEGATIVE mg/dL
Hgb urine dipstick: NEGATIVE
Ketones, ur: NEGATIVE mg/dL
Leukocytes,Ua: NEGATIVE
Nitrite: NEGATIVE
Protein, ur: NEGATIVE mg/dL
Specific Gravity, Urine: 1.019 (ref 1.005–1.030)
pH: 5 (ref 5.0–8.0)

## 2022-06-24 LAB — LIPASE, BLOOD: Lipase: 29 U/L (ref 11–51)

## 2022-06-24 LAB — I-STAT BETA HCG BLOOD, ED (MC, WL, AP ONLY): I-stat hCG, quantitative: <5 m[IU]/mL (ref <5)

## 2022-06-24 MED ORDER — ONDANSETRON HCL 4 MG/2ML IJ SOLN
4.0000 mg | Freq: Once | INTRAMUSCULAR | Status: AC
  Administered 2022-06-24: 4 mg via INTRAVENOUS
  Filled 2022-06-24: qty 2

## 2022-06-24 MED ORDER — DICYCLOMINE HCL 20 MG PO TABS: 20.0000 mg | ORAL_TABLET | Freq: Two times a day (BID) | ORAL | 0 refills | Status: AC

## 2022-06-24 MED ORDER — OXYCODONE HCL 5 MG PO TABS: 5.0000 mg | ORAL_TABLET | Freq: Four times a day (QID) | ORAL | 0 refills | Status: AC | PRN

## 2022-06-24 MED ORDER — MORPHINE SULFATE (PF) 4 MG/ML IV SOLN
4.0000 mg | Freq: Once | INTRAVENOUS | Status: AC
  Administered 2022-06-24: 4 mg via INTRAVENOUS
  Filled 2022-06-24: qty 1

## 2022-06-24 MED ORDER — HYDROMORPHONE HCL 1 MG/ML IJ SOLN
1.0000 mg | Freq: Once | INTRAMUSCULAR | Status: AC
  Administered 2022-06-24: 1 mg via INTRAVENOUS
  Filled 2022-06-24: qty 1

## 2022-06-24 MED ORDER — FAMOTIDINE IN NACL 20-0.9 MG/50ML-% IV SOLN
20.0000 mg | Freq: Once | INTRAVENOUS | Status: AC
  Administered 2022-06-24: 20 mg via INTRAVENOUS
  Filled 2022-06-24: qty 50

## 2022-06-24 MED ORDER — ONDANSETRON HCL 4 MG PO TABS: 4.0000 mg | ORAL_TABLET | ORAL | 0 refills | Status: AC | PRN

## 2022-06-24 MED ORDER — IOHEXOL 350 MG/ML SOLN
75.0000 mL | Freq: Once | INTRAVENOUS | Status: AC | PRN
  Administered 2022-06-24: 75 mL via INTRAVENOUS

## 2022-06-24 MED ORDER — HYOSCYAMINE SULFATE 0.5 MG/ML IJ SOLN
0.2500 mg | Freq: Once | INTRAMUSCULAR | Status: AC
  Administered 2022-06-24: 0.25 mg via INTRAVENOUS
  Filled 2022-06-24: qty 0.5

## 2022-06-24 MED ORDER — SODIUM CHLORIDE 0.9 % IV BOLUS
1000.0000 mL | Freq: Once | INTRAVENOUS | Status: AC
  Administered 2022-06-24: 1000 mL via INTRAVENOUS

## 2022-06-24 MED ORDER — KETOROLAC TROMETHAMINE 30 MG/ML IJ SOLN
30.0000 mg | Freq: Once | INTRAMUSCULAR | Status: AC
  Administered 2022-06-24: 30 mg via INTRAVENOUS
  Filled 2022-06-24: qty 1

## 2022-06-24 NOTE — Discharge Instructions (Addendum)
It was a pleasure caring for you today in the emergency department.  Please return to the emergency department for any worsening or worrisome symptoms.  There are many causes of abdominal pain. Most pain is not serious and goes away, but some pain gets worse, changes, or will not go away. Please return to the emergency department or see your doctor right away if you (or your family member) experience any of the following:  1. Pain that gets worse or moves to just one spot.  2. Pain that gets worse if you cough or sneeze.  3. Pain with going over a bump in the road.  4. Pain that does not get better in 24 hours.  5. Inability to keep down liquids (vomiting)-especially if you are making less urine.  6. Fainting.  7. Blood in the vomit or stool.  8. High fever or shaking chills.  9. Swelling of the abdomen.  10. Any new or worsening problem.      Follow-up Instructions  See your primary care provider if not completely better in the next 2-3 days. Please follow up with gastroenterology.   Additional Instructions  No alcohol.  No caffeine or cigarettes.

## 2022-06-24 NOTE — ED Notes (Signed)
Patient verbalizes understanding of discharge instructions. Opportunity for questioning and answers were provided. Armband removed by staff, pt discharged from ED. Pt taken to ED waiting room via wheel chair.  

## 2022-06-24 NOTE — ED Provider Notes (Signed)
[facility] EMERGENCY DEPARTMENT Provider Note   CSN: [id number] Arrival date & time: 06/24/22  1017     History  Chief Complaint  Patient presents with   Abdominal Pain    Laura Hudson is a 46 y.o. female.  Patient as above with significant medical history as below, including diverticulitis, IBS, panic disorder, bronchitis, anxiety who presents to the ED with complaint of abdominal pain.  Symptom onset 2 days ago, left-sided, nausea but no vomiting.  Pain primarily left lower quadrant, sharp, stabbing, "prickly" sensation.  Feels similar to prior episodes of diverticulitis.  Nausea no vomiting.  Unable to get refills on medication secondary to lack of health insurance, has not seen PCP in a long time.  She had some diarrhea on Friday no BRBPR or melena, assistance improved.  She is nauseated but not having vomiting.  No fevers or chills per no recent travel or sick contacts, no recent licit drug use, denies recent alcohol or THC use.  No chest pain or dyspnea.  [facility], Dr. Christella Hartigan [facility] 1/11; WNL   Past Medical History:  Diagnosis Date   Anemia    Anxiety    Asthma    Chronic bronchitis ([facility])    "couple times q yr" (03/17/2014)   Diverticulitis    IBS (irritable bowel syndrome)    Migraines    "worse in the last 2 months; now at least 2 times/wk" (03/17/2014)   Panic disorder     Past Surgical History:  Procedure Laterality Date   DILATION AND CURETTAGE OF UTERUS  1989   LAPAROSCOPIC ABDOMINAL EXPLORATION  ~ 1989; ~ 2004   TUBAL LIGATION  2005   VAGINAL HYSTERECTOMY  2006     The history is provided by the patient. No language interpreter was used.  Abdominal Pain Associated symptoms: diarrhea and nausea   Associated symptoms: no chest pain, no cough, no dysuria, no fever and no shortness of breath        Home Medications Prior to Admission medications   Medication Sig Start Date End Date Taking? Authorizing Provider  dicyclomine  (BENTYL) 20 MG tablet Take 1 tablet (20 mg total) by mouth 2 (two) times daily for 7 days. 06/24/22 07/01/22 Yes Tanda Rockers A, DO  ondansetron (ZOFRAN) 4 MG tablet Take 1 tablet (4 mg total) by mouth every 4 (four) hours as needed for nausea or vomiting. 06/24/22  Yes Tanda Rockers A, DO  oxyCODONE (ROXICODONE) 5 MG immediate release tablet Take 1 tablet (5 mg total) by mouth every 6 (six) hours as needed for severe pain. 06/24/22  Yes Sloan Leiter, DO  ALPRAZolam Prudy Feeler) 0.5 MG tablet Take 0.5 mg by mouth 3 (three) times daily as needed for anxiety.    [provider]  benzonatate (TESSALON) 100 MG capsule Take 1 capsule (100 mg total) by mouth every 8 (eight) hours. 03/24/19   Wurst, [place], PA-C  buPROPion (WELLBUTRIN SR) 100 MG 12 hr tablet Take 100 mg by mouth 2 (two) times daily.    [provider]  cetirizine-pseudoephedrine (ZYRTEC-D) 5-120 MG tablet Take 1 tablet by mouth daily. 03/24/19   Wurst, [place], PA-C  cyclobenzaprine (FLEXERIL) 10 MG tablet Take 1 tablet (10 mg total) by mouth 3 (three) times daily as needed for muscle spasms. Do not drink alcohol or drive while taking this medication.  May cause drowsiness. 08/02/21   Particia Nearing, PA-C  fluticasone [facility]) 50 MCG/ACT nasal spray Place 2 sprays into both nostrils daily.  03/24/19   Wurst, [place], PA-C  ondansetron (ZOFRAN ODT) 4 MG disintegrating tablet Take 1 tablet (4 mg total) by mouth every 8 (eight) hours as needed for nausea or vomiting. 12/26/19   Khatri, Hina, PA-C  omeprazole (PRILOSEC) 20 MG capsule Take 1 capsule (20 mg total) by mouth daily. 12/12/16 03/08/19  Deatra Canter, FNP  promethazine (PHENERGAN) 25 MG tablet Take 1 tablet (25 mg total) by mouth every 6 (six) hours as needed for nausea or vomiting. 03/24/17 03/08/19  Street, [place], PA-C      Allergies    Blueberry [vaccinium angustifolium] and Cephalexin    Review of Systems   Review of Systems  Constitutional:  Negative  for activity change and fever.  HENT:  Negative for facial swelling and trouble swallowing.   Eyes:  Negative for discharge and redness.  Respiratory:  Negative for cough and shortness of breath.   Cardiovascular:  Negative for chest pain and palpitations.  Gastrointestinal:  Positive for abdominal pain, diarrhea and nausea.  Genitourinary:  Negative for dysuria and flank pain.  Musculoskeletal:  Negative for back pain and gait problem.  Skin:  Negative for pallor and rash.  Neurological:  Negative for syncope and headaches.    Physical Exam Updated Vital Signs BP (!) 128/91 (BP Location: Left Arm)   Pulse 99   Temp 98.2 F (36.8 C) (Oral)   Resp 12   LMP 10/03/2011   SpO2 100%  Physical Exam Vitals and nursing note reviewed.  Constitutional:      General: She is not in acute distress.    Appearance: Normal appearance. She is well-developed. She is not ill-appearing or diaphoretic.  HENT:     Head: Normocephalic and atraumatic.     Right Ear: External ear normal.     Left Ear: External ear normal.     Nose: Nose normal.     Mouth/Throat:     Mouth: Mucous membranes are moist.  Eyes:     General: No scleral icterus.       Right eye: No discharge.        Left eye: No discharge.  Cardiovascular:     Rate and Rhythm: Normal rate and regular rhythm.     Pulses: Normal pulses.     Heart sounds: Normal heart sounds.  Pulmonary:     Effort: Pulmonary effort is normal. No respiratory distress.     Breath sounds: Normal breath sounds.  Abdominal:     General: Abdomen is flat.     Tenderness: There is abdominal tenderness in the epigastric area and left lower quadrant. There is no guarding or rebound.  Musculoskeletal:        General: Normal range of motion.     Cervical back: Normal range of motion.     Right lower leg: No edema.     Left lower leg: No edema.  Skin:    General: Skin is warm and dry.     Capillary Refill: Capillary refill takes less than 2 seconds.   Neurological:     Mental Status: She is alert and oriented to person, place, and time.     GCS: GCS eye subscore is 4. GCS verbal subscore is 5. GCS motor subscore is 6.  Psychiatric:        Mood and Affect: Mood normal.        Behavior: Behavior normal.     ED Results / Procedures / Treatments   Labs (all labs ordered are listed, but only  abnormal results are displayed) Labs Reviewed  CBC - Abnormal; Notable for the following components:      Result Value   RDW 16.0 (*)    All other components within normal limits  URINALYSIS, ROUTINE W REFLEX MICROSCOPIC - Abnormal; Notable for the following components:   APPearance HAZY (*)    All other components within normal limits  LIPASE, BLOOD  COMPREHENSIVE METABOLIC PANEL  I-STAT BETA HCG BLOOD, ED ([facility], [facility], [facility] ONLY)    EKG None  Radiology US Pelvis Complete  Result Date: 06/24/2022 CLINICAL DATA:  Left lower quadrant pain, status post hysterectomy EXAM: TRANSABDOMINAL ULTRASOUND OF PELVIS TECHNIQUE: Transabdominal ultrasound examination of the pelvis was performed including evaluation of the uterus, ovaries, adnexal regions, and pelvic cul-de-sac. COMPARISON:  CT abdomen/pelvis dated 06/24/2022 FINDINGS: Uterus Surgically absent. Right ovary Not discretely visualized.  No adnexal mass is seen. Left ovary Not discretely visualized. No adnexal mass is seen. Other findings:  No abnormal free fluid. IMPRESSION: Status post hysterectomy. Bilateral ovaries are not discretely visualized. No adnexal mass is seen. Electronically Signed   By: Charline Bills M.D.   On: 06/24/2022 20:47   CT ABDOMEN PELVIS W CONTRAST  Result Date: 06/24/2022 CLINICAL DATA:  Left lower quadrant abdominal pain EXAM: CT ABDOMEN AND PELVIS WITH CONTRAST TECHNIQUE: Multidetector CT imaging of the abdomen and pelvis was performed using the standard protocol following bolus administration of intravenous contrast. RADIATION DOSE REDUCTION: This exam was performed  according to the departmental dose-optimization program which includes automated exposure control, adjustment of the mA and/or kV according to patient size and/or use of iterative reconstruction technique. CONTRAST:  52mL OMNIPAQUE IOHEXOL 350 MG/ML SOLN COMPARISON:  03/24/2017 FINDINGS: Lower chest: Lung bases are clear. Hepatobiliary: Liver is within normal limits. Gallbladder is unremarkable. No intrahepatic or extrahepatic ductal dilatation. Pancreas: Within normal limits. Spleen: Within normal limits. Adrenals/Urinary Tract: Adrenal glands are within normal limits. Kidneys are within normal limits.  No hydronephrosis. Bladder is within normal limits. Stomach/Bowel: Stomach is within normal limits. No evidence of bowel obstruction. Normal appendix (series 3/image 55). No colonic wall thickening or inflammatory changes. Vascular/Lymphatic: No evidence of abdominal aortic aneurysm. No suspicious abdominopelvic lymphadenopathy. Reproductive: Status post hysterectomy. Bilateral ovaries are within normal limits. Other: No abdominopelvic ascites. Musculoskeletal: Mild degenerative changes at L5-S1. IMPRESSION: Negative CT abdomen/pelvis. Electronically Signed   By: Charline Bills M.D.   On: 06/24/2022 17:45    Procedures Procedures    Medications Ordered in ED Medications  sodium chloride 0.9 % bolus 1,000 mL (0 mLs Intravenous Stopped 06/24/22 1734)  morphine (PF) 4 MG/ML injection 4 mg (4 mg Intravenous Given 06/24/22 1538)  ondansetron (ZOFRAN) injection 4 mg (4 mg Intravenous Given 06/24/22 1536)  famotidine (PEPCID) IVPB 20 mg premix (0 mg Intravenous Stopped 06/24/22 1735)  hyoscyamine (LEVSIN) 0.5 MG/ML injection 0.25 mg (0.25 mg Intravenous Given 06/24/22 1540)  iohexol (OMNIPAQUE) 350 MG/ML injection 75 mL (75 mLs Intravenous Contrast Given 06/24/22 1728)  HYDROmorphone (DILAUDID) injection 1 mg (1 mg Intravenous Given 06/24/22 1842)  ondansetron (ZOFRAN) injection 4 mg (4 mg Intravenous  Given 06/24/22 2007)  ketorolac (TORADOL) 30 MG/ML injection 30 mg (30 mg Intravenous Given 06/24/22 2008)    ED Course/ Medical Decision Making/ A&P                           Medical Decision Making Amount and/or Complexity of Data Reviewed Labs: ordered. Radiology: ordered.  Risk Prescription drug management.  This patient presents to the ED with chief complaint(s) of abdominal pain, nausea with pertinent past medical history of VS, diverticulitis which further complicates the presenting complaint. The complaint involves an extensive differential diagnosis and also carries with it a high risk of complications and morbidity.    Differential diagnosis includes but is not exclusive to ectopic pregnancy, ovarian cyst, ovarian torsion, acute appendicitis, urinary tract infection, endometriosis, bowel obstruction, hernia, colitis, renal colic, gastroenteritis, volvulus etc.  . Serious etiologies were considered.   The initial plan is to screening labs ordered in triage, will get CT imaging, analgesics, IV fluids, antiemetic   Additional history obtained: Additional history obtained from  not applicable Records reviewed Primary Care Documents and prior GI notes, prior labs and imaging Home medications  Independent labs interpretation:  The following labs were independently interpreted:  UA without UTI, CMP and CBC are stable, lipase stable.  Pregnancy negative   Independent visualization of imaging: - I independently visualized the following imaging with scope of interpretation limited to determining acute life threatening conditions related to emergency care: CT abdomen pelvis with IV contrast, pelvic US which revealed no acute process  Cardiac monitoring was reviewed and interpreted by myself which shows na Pulse oximetry is 98 to 99% on room air  Treatment and Reassessment: Levsin, morphine, zofran, pepcid, ivf, toradol >> improved  She is able to tol PO intake w/o  problem  Consultation: - Consulted or discussed management/test interpretation w/ external professional: na  Consideration for admission or further workup: Admission was considered     Patient with left lower quadrant abdominal pain, work-up in the emergency department is reassuring.  Her symptoms have resolved after intervention in the ED.  She is HDS.  She is tolerant p.o. intake without difficulty.  Abdomen is soft, nontender, nondistended on reassessment.  Advised patient follow-up with gastroenterology and primary care doctor.  Discussed supportive care at home.  Strict return precautions were discussed  Looks like she saw [facility] in the past many years ago, ambu referral placed to [facility]  Encouraged etoh, tobacco, and thc cessation   The patient's overall condition has improved, the patient presents with abdominal pain without signs of peritonitis, or other life-threatening serious etiology. The patient understands that at this time there is no evidence for a more malignant underlying process, but the patient also understands that early in the process of an illness, an emergency department workup can be falsely reassuring. Detailed discussions were had with the patient regarding current findings, and need for close f/u with PCP or on call doctor. The patient appears stable for discharge and has been instructed to return immediately if the symptoms worsen in any way for re-evaluation. Patient verbalized understanding and is in agreement with current care plan.  All questions answered prior to discharge.    Social Determinants of health: Etoh, tobacco, thc Social History   Tobacco Use   Smoking status: Some Days    Packs/day: 0.50    Years: 13.00    Total pack years: 6.50    Types: Cigarettes   Smokeless tobacco: Never  Vaping Use   Vaping Use: Never used  Substance Use Topics   Alcohol use: Yes    Comment: 03/17/2014 "might have a drink on the holiday"   Drug use: Yes    Types:  Marijuana    Comment: 03/17/2014 "smoke marijuana 1-2 times/month"            Final Clinical Impression(s) / ED Diagnoses Final diagnoses:  Abdominal pain,  unspecified abdominal location    Rx / DC Orders ED Discharge Orders          Ordered    dicyclomine (BENTYL) 20 MG tablet  2 times daily        06/24/22 2101    oxyCODONE (ROXICODONE) 5 MG immediate release tablet  Every 6 hours PRN        06/24/22 2101    ondansetron (ZOFRAN) 4 MG tablet  Every 4 hours PRN        06/24/22 2101    Ambulatory referral to Gastroenterology        06/24/22 2101              Sloan LeiterGray, Krew Hortman A, DO 06/24/22 2208

## 2022-06-24 NOTE — ED Triage Notes (Signed)
Patient with history of IBS complains of left sided abdominal pain that started on Friday and feels different from her usual IBS complaint. Patient also complains of nausea and is in no apparent distress at this time.

## 2022-11-05 ENCOUNTER — Ambulatory Visit: Payer: Self-pay | Admitting: Gastroenterology
